# Patient Record
Sex: Male | Born: 1950 | Race: White | Hispanic: No | Marital: Married | Smoking: Former smoker
Health system: Southern US, Community
[De-identification: ages and names within clinical notes are randomized; demographics above are authoritative.]

## PROBLEM LIST (undated history)

## (undated) DIAGNOSIS — F32A Depression, unspecified: Secondary | ICD-10-CM

## (undated) DIAGNOSIS — T7840XA Allergy, unspecified, initial encounter: Secondary | ICD-10-CM

## (undated) DIAGNOSIS — M810 Age-related osteoporosis without current pathological fracture: Secondary | ICD-10-CM

## (undated) DIAGNOSIS — R011 Cardiac murmur, unspecified: Secondary | ICD-10-CM

## (undated) DIAGNOSIS — F419 Anxiety disorder, unspecified: Secondary | ICD-10-CM

## (undated) DIAGNOSIS — E785 Hyperlipidemia, unspecified: Secondary | ICD-10-CM

## (undated) DIAGNOSIS — C801 Malignant (primary) neoplasm, unspecified: Secondary | ICD-10-CM

## (undated) DIAGNOSIS — M1990 Unspecified osteoarthritis, unspecified site: Secondary | ICD-10-CM

## (undated) DIAGNOSIS — I10 Essential (primary) hypertension: Secondary | ICD-10-CM

## (undated) DIAGNOSIS — K219 Gastro-esophageal reflux disease without esophagitis: Secondary | ICD-10-CM

## (undated) HISTORY — DX: Allergy, unspecified, initial encounter: T78.40XA

## (undated) HISTORY — DX: Hyperlipidemia, unspecified: E78.5

## (undated) HISTORY — DX: Depression, unspecified: F32.A

## (undated) HISTORY — DX: Anxiety disorder, unspecified: F41.9

## (undated) HISTORY — DX: Malignant (primary) neoplasm, unspecified: C80.1

## (undated) HISTORY — DX: Cardiac murmur, unspecified: R01.1

## (undated) HISTORY — DX: Unspecified osteoarthritis, unspecified site: M19.90

## (undated) HISTORY — DX: Age-related osteoporosis without current pathological fracture: M81.0

---

## 2005-05-10 HISTORY — PX: THULIUM LASER TURP (TRANSURETHRAL RESECTION OF PROSTATE): SHX6744

## 2005-09-21 ENCOUNTER — Emergency Department (HOSPITAL_COMMUNITY): Admission: EM | Admit: 2005-09-21 | Discharge: 2005-09-22 | Payer: Self-pay | Admitting: Emergency Medicine

## 2005-09-22 ENCOUNTER — Emergency Department (HOSPITAL_COMMUNITY): Admission: EM | Admit: 2005-09-22 | Discharge: 2005-09-22 | Payer: Self-pay | Admitting: Emergency Medicine

## 2005-10-20 ENCOUNTER — Emergency Department (HOSPITAL_COMMUNITY): Admission: EM | Admit: 2005-10-20 | Discharge: 2005-10-21 | Payer: Self-pay | Admitting: Emergency Medicine

## 2005-10-24 ENCOUNTER — Emergency Department (HOSPITAL_COMMUNITY): Admission: EM | Admit: 2005-10-24 | Discharge: 2005-10-24 | Payer: Self-pay | Admitting: Emergency Medicine

## 2006-08-10 ENCOUNTER — Emergency Department (HOSPITAL_COMMUNITY): Admission: EM | Admit: 2006-08-10 | Discharge: 2006-08-10 | Payer: Self-pay | Admitting: Emergency Medicine

## 2006-08-11 ENCOUNTER — Ambulatory Visit (HOSPITAL_COMMUNITY): Admission: RE | Admit: 2006-08-11 | Discharge: 2006-08-11 | Payer: Self-pay | Admitting: Emergency Medicine

## 2008-05-10 HISTORY — PX: ROTATOR CUFF REPAIR: SHX139

## 2009-09-10 ENCOUNTER — Encounter: Admission: RE | Admit: 2009-09-10 | Discharge: 2009-09-10 | Payer: Self-pay | Admitting: Orthopedic Surgery

## 2010-05-31 ENCOUNTER — Encounter: Payer: Self-pay | Admitting: Orthopedic Surgery

## 2012-09-28 ENCOUNTER — Other Ambulatory Visit: Payer: Self-pay | Admitting: Family Medicine

## 2012-09-28 ENCOUNTER — Ambulatory Visit
Admission: RE | Admit: 2012-09-28 | Discharge: 2012-09-28 | Disposition: A | Discharge status: Home / Self Care | Payer: 59 | Source: Ambulatory Visit | Attending: Family Medicine | Admitting: Family Medicine

## 2012-09-28 DIAGNOSIS — R0789 Other chest pain: Secondary | ICD-10-CM

## 2012-10-25 LAB — HM COLONOSCOPY

## 2013-10-04 ENCOUNTER — Emergency Department (HOSPITAL_COMMUNITY)
Admission: EM | Admit: 2013-10-04 | Discharge: 2013-10-04 | Disposition: A | Discharge status: Home / Self Care | Payer: Managed Care, Other (non HMO) | Attending: Emergency Medicine | Admitting: Emergency Medicine

## 2013-10-04 ENCOUNTER — Encounter (HOSPITAL_COMMUNITY): Payer: Self-pay | Admitting: Emergency Medicine

## 2013-10-04 DIAGNOSIS — R110 Nausea: Secondary | ICD-10-CM | POA: Insufficient documentation

## 2013-10-04 DIAGNOSIS — K219 Gastro-esophageal reflux disease without esophagitis: Secondary | ICD-10-CM | POA: Insufficient documentation

## 2013-10-04 DIAGNOSIS — Z79899 Other long term (current) drug therapy: Secondary | ICD-10-CM | POA: Insufficient documentation

## 2013-10-04 DIAGNOSIS — R197 Diarrhea, unspecified: Secondary | ICD-10-CM

## 2013-10-04 DIAGNOSIS — I10 Essential (primary) hypertension: Secondary | ICD-10-CM | POA: Insufficient documentation

## 2013-10-04 DIAGNOSIS — R1084 Generalized abdominal pain: Secondary | ICD-10-CM | POA: Insufficient documentation

## 2013-10-04 DIAGNOSIS — E876 Hypokalemia: Secondary | ICD-10-CM | POA: Insufficient documentation

## 2013-10-04 HISTORY — DX: Essential (primary) hypertension: I10

## 2013-10-04 HISTORY — DX: Gastro-esophageal reflux disease without esophagitis: K21.9

## 2013-10-04 LAB — CBC WITH DIFFERENTIAL/PLATELET
BASOS ABS: 0 10*3/uL (ref 0.0–0.1)
BASOS PCT: 0 % (ref 0–1)
EOS ABS: 0 10*3/uL (ref 0.0–0.7)
Eosinophils Relative: 0 % (ref 0–5)
HCT: 40.2 % (ref 39.0–52.0)
Hemoglobin: 13.8 g/dL (ref 13.0–17.0)
LYMPHS PCT: 17 % (ref 12–46)
Lymphs Abs: 0.8 10*3/uL (ref 0.7–4.0)
MCH: 26.8 pg (ref 26.0–34.0)
MCHC: 34.3 g/dL (ref 30.0–36.0)
MCV: 78.2 fL (ref 78.0–100.0)
Monocytes Absolute: 1 10*3/uL (ref 0.1–1.0)
Monocytes Relative: 22 % — ABNORMAL HIGH (ref 3–12)
NEUTROS PCT: 61 % (ref 43–77)
Neutro Abs: 2.8 10*3/uL (ref 1.7–7.7)
PLATELETS: 200 10*3/uL (ref 150–400)
RBC: 5.14 MIL/uL (ref 4.22–5.81)
RDW: 14.3 % (ref 11.5–15.5)
WBC: 4.6 10*3/uL (ref 4.0–10.5)

## 2013-10-04 LAB — URINALYSIS, ROUTINE W REFLEX MICROSCOPIC
Bilirubin Urine: NEGATIVE
GLUCOSE, UA: NEGATIVE mg/dL
Hgb urine dipstick: NEGATIVE
KETONES UR: NEGATIVE mg/dL
Leukocytes, UA: NEGATIVE
Nitrite: NEGATIVE
PH: 7 (ref 5.0–8.0)
Protein, ur: NEGATIVE mg/dL
SPECIFIC GRAVITY, URINE: 1.005 (ref 1.005–1.030)
Urobilinogen, UA: 0.2 mg/dL (ref 0.0–1.0)

## 2013-10-04 LAB — COMPREHENSIVE METABOLIC PANEL
ALT: 24 U/L (ref 0–53)
AST: 30 U/L (ref 0–37)
Albumin: 3.3 g/dL — ABNORMAL LOW (ref 3.5–5.2)
Alkaline Phosphatase: 52 U/L (ref 39–117)
BILIRUBIN TOTAL: 0.3 mg/dL (ref 0.3–1.2)
BUN: 15 mg/dL (ref 6–23)
CALCIUM: 9.2 mg/dL (ref 8.4–10.5)
CO2: 26 meq/L (ref 19–32)
CREATININE: 0.89 mg/dL (ref 0.50–1.35)
Chloride: 92 mEq/L — ABNORMAL LOW (ref 96–112)
Glucose, Bld: 116 mg/dL — ABNORMAL HIGH (ref 70–99)
Potassium: 3.1 mEq/L — ABNORMAL LOW (ref 3.7–5.3)
SODIUM: 134 meq/L — AB (ref 137–147)
TOTAL PROTEIN: 7.3 g/dL (ref 6.0–8.3)

## 2013-10-04 LAB — LIPASE, BLOOD: Lipase: 57 U/L (ref 11–59)

## 2013-10-04 LAB — CLOSTRIDIUM DIFFICILE BY PCR: Toxigenic C. Difficile by PCR: NEGATIVE

## 2013-10-04 MED ORDER — DIPHENOXYLATE-ATROPINE 2.5-0.025 MG PO TABS: 2.0000 | ORAL_TABLET | Freq: Four times a day (QID) | ORAL | Status: DC | PRN

## 2013-10-04 MED ORDER — SODIUM CHLORIDE 0.9 % IV BOLUS (SEPSIS)
1000.0000 mL | INTRAVENOUS | Status: AC
  Administered 2013-10-04: 1000 mL via INTRAVENOUS

## 2013-10-04 MED ORDER — DIPHENOXYLATE-ATROPINE 2.5-0.025 MG PO TABS
2.0000 | ORAL_TABLET | ORAL | Status: AC
Start: 2013-10-04 — End: 2013-10-04
  Administered 2013-10-04: 2 via ORAL
  Filled 2013-10-04: qty 2

## 2013-10-04 MED ORDER — POTASSIUM CHLORIDE CRYS ER 20 MEQ PO TBCR
40.0000 meq | EXTENDED_RELEASE_TABLET | Freq: Once | ORAL | Status: AC
  Administered 2013-10-04: 40 meq via ORAL
  Filled 2013-10-04: qty 2

## 2013-10-04 NOTE — Discharge Instructions (Signed)
Take lomotil as needed for diarrhea. Refer to attached documents for more information. Return to the ED with worsening or concerning symptoms. Follow up with your doctor. You will be contacted with results of your stool culture.

## 2013-10-04 NOTE — ED Notes (Signed)
Bed: WA07 Expected date:  Expected time:  Means of arrival:  Comments: 

## 2013-10-04 NOTE — ED Provider Notes (Signed)
CSN: [id number]     Arrival date & time 10/04/13  4010 History   First MD Initiated Contact with Patient 10/04/13 1012     Chief Complaint  Patient presents with  . Diarrhea     (Consider location/radiation/quality/duration/timing/severity/associated sxs/prior Treatment) HPI Comments: Patient is a 63 year old male with a past medical history of hypertension and GERD who presents with a 5 days history of diarrhea. Patient reports 7-8 episodes of watery stool per day Symptoms started gradually and progressively worsened since the onset. Patient reports associated lower abdominal cramping and nausea. He saw his PCP who prescribed hyoscyamine and imodium for symptoms which provide some relief. Patient called his PCP this morning due to persistent symptoms and was instructed to come to the ED. Patient's PCP is concerned for viral gastroenteritis. Patient denies recent antibiotic use or foreign travel. He denies any other associated symptoms and recent foreign travel. No known sick contacts.    Past Medical History  Diagnosis Date  . Hypertension   . GERD (gastroesophageal reflux disease)    History reviewed. No pertinent past surgical history. History reviewed. No pertinent family history. History  Substance Use Topics  . Smoking status: Never Smoker   . Smokeless tobacco: Not on file  . Alcohol Use: Yes    Review of Systems  Constitutional: Negative for fever, chills and fatigue.  HENT: Negative for trouble swallowing.   Eyes: Negative for visual disturbance.  Respiratory: Negative for shortness of breath.   Cardiovascular: Negative for chest pain and palpitations.  Gastrointestinal: Positive for nausea, abdominal pain and diarrhea. Negative for vomiting and blood in stool.  Genitourinary: Negative for dysuria and difficulty urinating.  Musculoskeletal: Negative for arthralgias and neck pain.  Skin: Negative for color change.  Neurological: Negative for dizziness and weakness.   Psychiatric/Behavioral: Negative for dysphoric mood.      Allergies  No known allergies  Home Medications   Prior to Admission medications   Medication Sig Start Date End Date Taking? Authorizing Provider  hyoscyamine (LEVSIN SL) 0.125 MG SL tablet Place 0.125 mg under the tongue as needed.   Yes Historical Provider, MD  ibuprofen (ADVIL,MOTRIN) 200 MG tablet Take 400 mg by mouth every 6 (six) hours as needed for mild pain.   Yes Historical Provider, MD  loperamide (IMODIUM A-D) 2 MG tablet Take 2 mg by mouth 4 (four) times daily as needed for diarrhea or loose stools.   Yes Historical Provider, MD  naproxen sodium (ANAPROX) 550 MG tablet Take 550 mg by mouth 2 (two) times daily between meals as needed.   Yes Historical Provider, MD  pantoprazole (PROTONIX) 40 MG tablet Take 40 mg by mouth daily.   Yes Historical Provider, MD  pravastatin (PRAVACHOL) 40 MG tablet Take 40 mg by mouth daily.   Yes Historical Provider, MD  valsartan-hydrochlorothiazide (DIOVAN-HCT) 320-12.5 MG per tablet Take 1 tablet by mouth daily.   Yes Historical Provider, MD  venlafaxine (EFFEXOR) 37.5 MG tablet Take 37.5 mg by mouth 2 (two) times daily.   Yes Historical Provider, MD   BP 120/72  Pulse 100  Temp(Src) 98.7 F (37.1 C) (Oral)  Resp 18  SpO2 100% Physical Exam  Nursing note and vitals reviewed. Constitutional: He appears well-developed and well-nourished. No distress.  HENT:  Head: Normocephalic and atraumatic.  Eyes: Conjunctivae and EOM are normal.  Neck: Normal range of motion.  Cardiovascular: Normal rate and regular rhythm.  Exam reveals no gallop and no friction rub.   No murmur heard.  Pulmonary/Chest: Effort normal and breath sounds normal. He has no wheezes. He has no rales. He exhibits no tenderness.  Abdominal: Soft. He exhibits no distension. There is tenderness. There is no rebound and no guarding.  Mild generalized lower abdominal tenderness to palpation. No focal tenderness or  peritoneal signs.   Musculoskeletal: Normal range of motion.  Neurological: He is alert.  Speech is goal-oriented. Moves limbs without ataxia.   Skin: Skin is warm and dry.  Psychiatric: He has a normal mood and affect. His behavior is normal.    ED Course  Procedures (including critical care time) Labs Review Labs Reviewed  CBC WITH DIFFERENTIAL - Abnormal; Notable for the following:    Monocytes Relative 22 (*)    All other components within normal limits  COMPREHENSIVE METABOLIC PANEL - Abnormal; Notable for the following:    Sodium 134 (*)    Potassium 3.1 (*)    Chloride 92 (*)    Glucose, Bld 116 (*)    Albumin 3.3 (*)    All other components within normal limits  STOOL CULTURE  CLOSTRIDIUM DIFFICILE BY PCR  LIPASE, BLOOD  URINALYSIS, ROUTINE W REFLEX MICROSCOPIC    Imaging Review No results found.   EKG Interpretation None      MDM   Final diagnoses:  Diarrhea   10:31 AM Labs show hypokalemia. Patient will have fluids, PO potassium and lomotil for diarrhea. Patient mildly tachycardic with other vitals stable.   12:36 PM Patient provided a stool sample for culture. Patient will have another liter of fluids and be discharged with lomotil. Patient will follow up with PCP as needed. Patient instructed to return to the ED with worsening or concerning symptoms.    Alvina Chou, PA-C 10/04/13 1305

## 2013-10-04 NOTE — Progress Notes (Signed)
  CARE MANAGEMENT ED NOTE 10/04/2013  Patient:  Logan Wong, Logan Wong   Account Number:  [id number]  Date Initiated:  10/04/2013  Documentation initiated by:  Jackelyn Poling  Subjective/Objective Assessment:   63 yr old male [employer] managed [facility] pt without pcp listed c/o diarrhea since Sunday.  Dry heaves yesterday. Was sent in by PCP for possible viral gastroenteritis. States that he has been having a low abd cramping.  Was given some     Subjective/Objective Assessment Detail:   Was given something by his doctor for the cramping that he states has been helping a little bit.  Pt confirms pcp as Dr Maury Dus at [facility]     Action/Plan:   EPIC updated   Action/Plan Detail:   Anticipated DC Date:       Status Recommendation to Physician:   Result of Recommendation:    Other ED [place]  Other  PCP issues  Outpatient Services - Pt will follow up    Choice offered to / List presented to:            Status of service:  Completed, signed off  ED Comments:   ED Comments Detail:

## 2013-10-04 NOTE — ED Notes (Signed)
Pt c/o diarrhea since Sunday.  Dry heaves yesterday.  Was sent in by PCP for possible viral gastroenteritis.  States that he has been having a low abd cramping.  Was given something by his doctor for the cramping that he states has been helping a little bit.

## 2013-10-04 NOTE — ED Provider Notes (Signed)
Medical screening examination/treatment/procedure(s) were performed by non-physician practitioner and as supervising physician I was immediately available for consultation/collaboration.   EKG Interpretation None      Rolland Porter, MD, Abram Sander   Janice Norrie, MD 10/04/13 1309

## 2013-10-06 ENCOUNTER — Emergency Department (HOSPITAL_COMMUNITY)
Admission: EM | Admit: 2013-10-06 | Discharge: 2013-10-06 | Disposition: A | Discharge status: Home / Self Care | Payer: Managed Care, Other (non HMO) | Attending: Emergency Medicine | Admitting: Emergency Medicine

## 2013-10-06 ENCOUNTER — Encounter (HOSPITAL_COMMUNITY): Payer: Self-pay | Admitting: Emergency Medicine

## 2013-10-06 DIAGNOSIS — E876 Hypokalemia: Secondary | ICD-10-CM | POA: Insufficient documentation

## 2013-10-06 DIAGNOSIS — I10 Essential (primary) hypertension: Secondary | ICD-10-CM | POA: Insufficient documentation

## 2013-10-06 DIAGNOSIS — K219 Gastro-esophageal reflux disease without esophagitis: Secondary | ICD-10-CM | POA: Insufficient documentation

## 2013-10-06 DIAGNOSIS — R197 Diarrhea, unspecified: Secondary | ICD-10-CM

## 2013-10-06 DIAGNOSIS — Z79899 Other long term (current) drug therapy: Secondary | ICD-10-CM | POA: Insufficient documentation

## 2013-10-06 LAB — CBC
HEMATOCRIT: 37.1 % — AB (ref 39.0–52.0)
Hemoglobin: 12.8 g/dL — ABNORMAL LOW (ref 13.0–17.0)
MCH: 27.1 pg (ref 26.0–34.0)
MCHC: 34.5 g/dL (ref 30.0–36.0)
MCV: 78.4 fL (ref 78.0–100.0)
PLATELETS: 243 10*3/uL (ref 150–400)
RBC: 4.73 MIL/uL (ref 4.22–5.81)
RDW: 14.4 % (ref 11.5–15.5)
WBC: 8.3 10*3/uL (ref 4.0–10.5)

## 2013-10-06 LAB — BASIC METABOLIC PANEL
BUN: 10 mg/dL (ref 6–23)
CO2: 25 meq/L (ref 19–32)
Calcium: 8.9 mg/dL (ref 8.4–10.5)
Chloride: 95 mEq/L — ABNORMAL LOW (ref 96–112)
Creatinine, Ser: 0.79 mg/dL (ref 0.50–1.35)
Glucose, Bld: 98 mg/dL (ref 70–99)
POTASSIUM: 3 meq/L — AB (ref 3.7–5.3)
Sodium: 134 mEq/L — ABNORMAL LOW (ref 137–147)

## 2013-10-06 MED ORDER — SODIUM CHLORIDE 0.9 % IV BOLUS (SEPSIS)
1000.0000 mL | Freq: Once | INTRAVENOUS | Status: AC
  Administered 2013-10-06: 1000 mL via INTRAVENOUS

## 2013-10-06 MED ORDER — POTASSIUM CHLORIDE 20 MEQ/15ML (10%) PO LIQD: 40.0000 meq | Freq: Every day | ORAL | Status: DC

## 2013-10-06 MED ORDER — POTASSIUM CHLORIDE 20 MEQ/15ML (10%) PO LIQD
40.0000 meq | Freq: Once | ORAL | Status: AC
  Administered 2013-10-06: 40 meq via ORAL
  Filled 2013-10-06: qty 30

## 2013-10-06 NOTE — ED Provider Notes (Signed)
CSN: [id number]     Arrival date & time 10/06/13  0813 History   First MD Initiated Contact with Patient 10/06/13 [phone]     Chief Complaint  Patient presents with  . Diarrhea      HPI Patient reports ongoing diarrhea with past week.  He initially was having some myalgias thanks was feeling weak earlier in the week but now he states he feels much better.  He was seen in emergency department several days ago.  He states the only reason he returns to the ER today because of ongoing diarrhea.  He was tested for C. difficile colitis which was negative.  Stool cultures are pending.  No recent antibiotics.  No recent travel outside the [place].  He did visit a family member in the hospital setting a week ago.  The family member was not having diarrhea.  He has a history of hypertension and gastroesophageal reflux disease.  His medications have been stable.  He denies blood in the stool.  No fevers or chills.  Some abdominal cramping.  No current abdominal pain.   Past Medical History  Diagnosis Date  . Hypertension   . GERD (gastroesophageal reflux disease)    No past surgical history on file. No family history on file. History  Substance Use Topics  . Smoking status: Never Smoker   . Smokeless tobacco: Not on file  . Alcohol Use: Yes    Review of Systems  All other systems reviewed and are negative.     Allergies  No known allergies  Home Medications   Prior to Admission medications   Medication Sig Start Date End Date Taking? Authorizing Provider  Calcium-Vitamin D-Vitamin K (VIACTIV) [phone] MG-UNT-MCG CHEW Chew 1 tablet by mouth every morning.   Yes Historical Provider, MD  diphenoxylate-atropine (LOMOTIL) 2.5-0.025 MG per tablet Take 2 tablets by mouth 4 (four) times daily as needed for diarrhea or loose stools. 10/04/13  Yes Alvina Chou, PA-C  Multiple Vitamin (MULTIVITAMIN WITH MINERALS) TABS tablet Take 1 tablet by mouth every morning.   Yes Historical  Provider, MD  pantoprazole (PROTONIX) 40 MG tablet Take 40 mg by mouth every morning.    Yes Historical Provider, MD  pravastatin (PRAVACHOL) 40 MG tablet Take 40 mg by mouth every morning.    Yes Historical Provider, MD  testosterone (ANDROGEL) 50 MG/5GM (1%) GEL Place 5 g onto the skin every morning.   Yes Historical Provider, MD  valsartan-hydrochlorothiazide (DIOVAN-HCT) 320-12.5 MG per tablet Take 1 tablet by mouth every morning.    Yes Historical Provider, MD  venlafaxine (EFFEXOR) 37.5 MG tablet Take 37.5 mg by mouth 2 (two) times daily.   Yes Historical Provider, MD  potassium chloride 20 MEQ/15ML (10%) solution Take 30 mLs (40 mEq total) by mouth daily. 10/06/13   Hoy Morn, MD   BP 119/69  Pulse 110  Temp(Src) 98.4 F (36.9 C) (Oral)  Resp 18  SpO2 100% Physical Exam  Nursing note and vitals reviewed. Constitutional: He is oriented to person, place, and time. He appears well-developed and well-nourished.  HENT:  Head: Normocephalic and atraumatic.  Eyes: EOM are normal.  Neck: Normal range of motion.  Cardiovascular: Normal rate, regular rhythm, normal heart sounds and intact distal pulses.   Pulmonary/Chest: Effort normal and breath sounds normal. No respiratory distress.  Abdominal: Soft. He exhibits no distension. There is no tenderness.  Musculoskeletal: Normal range of motion.  Neurological: He is alert and oriented to person, place, and time.  Skin:  Skin is warm and dry.  Psychiatric: He has a normal mood and affect. Judgment normal.    ED Course  Procedures (including critical care time) Labs Review Labs Reviewed  CBC - Abnormal; Notable for the following:    Hemoglobin 12.8 (*)    HCT 37.1 (*)    All other components within normal limits  BASIC METABOLIC PANEL - Abnormal; Notable for the following:    Sodium 134 (*)    Potassium 3.0 (*)    Chloride 95 (*)    All other components within normal limits    Imaging Review No results found.   EKG  Interpretation None      MDM   Final diagnoses:  Diarrhea  Hypokalemia    Overall the patient is very well appearing.  He agrees he is feeling better.  He's just concerned about the ongoing diarrhea for 7 days.  He will followup with GI.  C. difficile was negative.  Potassium was replaced the emergency department and he will be sent home with a short course of potassium supplementation.  He understands the importance of GI followup.  If this continues he may require colonoscopy.  All questions answered.  Patient understands to return to the ER for new or worsening symptoms    Hoy Morn, MD 10/06/13 1027

## 2013-10-06 NOTE — ED Notes (Signed)
Pt reports less frequent diarrhea but still watery/brown diarrhea post eating. Pt denies n/v.

## 2013-10-06 NOTE — Discharge Instructions (Signed)
Diarrhea Diarrhea is frequent loose and watery bowel movements. It can cause you to feel weak and dehydrated. Dehydration can cause you to become tired and thirsty, have a dry mouth, and have decreased urination that often is dark yellow. Diarrhea is a sign of another problem, most often an infection that will not last long. In most cases, diarrhea typically lasts 2 3 days. However, it can last longer if it is a sign of something more serious. It is important to treat your diarrhea as directed by your caregive to lessen or prevent future episodes of diarrhea. CAUSES  Some common causes include:  Gastrointestinal infections caused by viruses, bacteria, or parasites.  Food poisoning or food allergies.  Certain medicines, such as antibiotics, chemotherapy, and laxatives.  Artificial sweeteners and fructose.  Digestive disorders. HOME CARE INSTRUCTIONS  Ensure adequate fluid intake (hydration): have 1 cup (8 oz) of fluid for each diarrhea episode. Avoid fluids that contain simple sugars or sports drinks, fruit juices, whole milk products, and sodas. Your urine should be clear or pale yellow if you are drinking enough fluids. Hydrate with an oral rehydration solution that you can purchase at pharmacies, retail stores, and online. You can prepare an oral rehydration solution at home by mixing the following ingredients together:    tsp table salt.   tsp baking soda.   tsp salt substitute containing potassium chloride.  1  tablespoons sugar.  1 L (34 oz) of water.  Certain foods and beverages may increase the speed at which food moves through the gastrointestinal (GI) tract. These foods and beverages should be avoided and include:  Caffeinated and alcoholic beverages.  High-fiber foods, such as raw fruits and vegetables, nuts, seeds, and whole grain breads and cereals.  Foods and beverages sweetened with sugar alcohols, such as xylitol, sorbitol, and mannitol.  Some foods may be well  tolerated and may help thicken stool including:  Starchy foods, such as rice, toast, pasta, low-sugar cereal, oatmeal, grits, baked potatoes, crackers, and bagels.  Bananas.  Applesauce.  Add probiotic-rich foods to help increase healthy bacteria in the GI tract, such as yogurt and fermented milk products.  Wash your hands well after each diarrhea episode.  Only take over-the-counter or prescription medicines as directed by your caregiver.  Take a warm bath to relieve any burning or pain from frequent diarrhea episodes. SEEK IMMEDIATE MEDICAL CARE IF:   You are unable to keep fluids down.  You have persistent vomiting.  You have blood in your stool, or your stools are black and tarry.  You do not urinate in 6 8 hours, or there is only a small amount of very dark urine.  You have abdominal pain that increases or localizes.  You have weakness, dizziness, confusion, or lightheadedness.  You have a severe headache.  Your diarrhea gets worse or does not get better.  You have a fever or persistent symptoms for more than 2 3 days.  You have a fever and your symptoms suddenly get worse. MAKE SURE YOU:   Understand these instructions.  Will watch your condition.  Will get help right away if you are not doing well or get worse. Document Released: 04/16/2002 Document Revised: 04/12/2012 Document Reviewed: 01/02/2012 ExitCare Patient Information 2014 [employer].  

## 2013-10-06 NOTE — ED Notes (Signed)
Pt was here 2 days ago for diarrhea.  Pt was given lamotil.  States that it helps make it less frequent but the consistency is the same.

## 2013-10-08 ENCOUNTER — Telehealth (HOSPITAL_BASED_OUTPATIENT_CLINIC_OR_DEPARTMENT_OTHER): Payer: Self-pay | Admitting: Emergency Medicine

## 2013-10-09 ENCOUNTER — Telehealth (HOSPITAL_BASED_OUTPATIENT_CLINIC_OR_DEPARTMENT_OTHER): Payer: Self-pay | Admitting: Emergency Medicine

## 2013-10-09 NOTE — Progress Notes (Signed)
ED Antimicrobial Stewardship Positive Culture Follow Up   Logan Wong is an 63 y.o. male who presented to [facility] on 10/06/2013 with a chief complaint of  Chief Complaint  Patient presents with  . Diarrhea    Recent Results (from the past 720 hour(s))  STOOL CULTURE     Status: None   Collection Time    10/04/13 11:23 AM      Result Value Ref Range Status   Specimen Description STOOL   Final   Special Requests Normal   Final   Culture     Final   Value: SALMONELLA SPECIES     Note: CRITICAL RESULT CALLED TO, READ BACK BY AND VERIFIED WITH: TRACI [email or web address]  ON [id number] BY [facility] REPORT FAXED BY REQUEST CRITICAL RESULT CALLED TO, READ BACK BY AND VERIFIED WITH: LYNN [email or web address]  ON [id number] BY [place] FAXED TO [facility] AT 4:30PM      ON 10/08/2013 BY MARCHE     Performed at [facility]   Report Status PENDING   Incomplete   Organism ID, Bacteria SALMONELLA SPECIES   Final  CLOSTRIDIUM DIFFICILE BY PCR     Status: None   Collection Time    10/04/13 12:39 PM      Result Value Ref Range Status   C difficile by pcr NEGATIVE  NEGATIVE Final   Comment: [facility]    [x]  Patient discharged originally without antimicrobial agent and treatment is now indicated.   Patient presented with chief complaint of persistent diarrhea. He tested negative for C.dif, no recent antibiotics, or travel outside of the [place] Stool culture came back positive for salmonella that is sensitive to ciprofloxacin and Bactrim.   New antibiotic prescription: Ciprofloxacin 500mg  PO BID x 7 days  ED Provider: Quincy Carnes, PA  [name] 10/09/2013, 9:33 AM Infectious Diseases Pharmacist Phone# [phone]

## 2013-10-09 NOTE — Telephone Encounter (Signed)
Post ED Visit - Positive Culture Follow-up: Successful Patient Follow-Up  Culture assessed and recommendations reviewed by: []  [place], Pharm.D., BCPS []  Heide Guile, Pharm.D., BCPS []  Alycia Rossetti, Pharm.D., BCPS [x]  [place], Pharm.D., BCPS, AAHIVP []  Legrand [place], Pharm.D., BCPS, AAHIVP  Positive stool culture  [x]  Patient discharged without antimicrobial prescription and treatment is now indicated []  Organism is resistant to prescribed ED discharge antimicrobial []  Patient with positive blood cultures  Changes discussed with ED provider: Quincy Carnes PA-C New antibiotic prescription: Cipro 500 mg BID x 7 days    [facility] 10/09/2013, 1:03 PM

## 2013-10-13 NOTE — Telephone Encounter (Addendum)
Unable to contact patient via phone. Sent letter. °

## 2013-11-09 LAB — STOOL CULTURE: Special Requests: NORMAL

## 2013-12-04 ENCOUNTER — Telehealth (HOSPITAL_BASED_OUTPATIENT_CLINIC_OR_DEPARTMENT_OTHER): Payer: Self-pay | Admitting: Emergency Medicine

## 2013-12-04 NOTE — Telephone Encounter (Signed)
No response to letter sent after 30 days. Chart sent to Medical Records. °

## 2015-01-10 ENCOUNTER — Ambulatory Visit
Admission: RE | Admit: 2015-01-10 | Discharge: 2015-01-10 | Disposition: A | Discharge status: Home / Self Care | Payer: Managed Care, Other (non HMO) | Source: Ambulatory Visit | Attending: Family Medicine | Admitting: Family Medicine

## 2015-01-10 ENCOUNTER — Other Ambulatory Visit: Payer: Self-pay | Admitting: Family Medicine

## 2015-01-10 DIAGNOSIS — M19042 Primary osteoarthritis, left hand: Secondary | ICD-10-CM

## 2015-01-10 DIAGNOSIS — M19041 Primary osteoarthritis, right hand: Secondary | ICD-10-CM

## 2015-09-09 DIAGNOSIS — K219 Gastro-esophageal reflux disease without esophagitis: Secondary | ICD-10-CM | POA: Diagnosis not present

## 2015-09-09 DIAGNOSIS — E782 Mixed hyperlipidemia: Secondary | ICD-10-CM | POA: Diagnosis not present

## 2015-09-09 DIAGNOSIS — I10 Essential (primary) hypertension: Secondary | ICD-10-CM | POA: Diagnosis not present

## 2015-09-09 DIAGNOSIS — E291 Testicular hypofunction: Secondary | ICD-10-CM | POA: Diagnosis not present

## 2015-09-09 DIAGNOSIS — Z0000 Encounter for general adult medical examination without abnormal findings: Secondary | ICD-10-CM | POA: Diagnosis not present

## 2015-10-21 DIAGNOSIS — E782 Mixed hyperlipidemia: Secondary | ICD-10-CM | POA: Diagnosis not present

## 2015-10-21 DIAGNOSIS — Z125 Encounter for screening for malignant neoplasm of prostate: Secondary | ICD-10-CM | POA: Diagnosis not present

## 2015-10-21 DIAGNOSIS — E559 Vitamin D deficiency, unspecified: Secondary | ICD-10-CM | POA: Diagnosis not present

## 2015-10-21 DIAGNOSIS — I10 Essential (primary) hypertension: Secondary | ICD-10-CM | POA: Diagnosis not present

## 2015-10-27 DIAGNOSIS — B078 Other viral warts: Secondary | ICD-10-CM | POA: Diagnosis not present

## 2015-12-15 DIAGNOSIS — M19041 Primary osteoarthritis, right hand: Secondary | ICD-10-CM | POA: Diagnosis not present

## 2015-12-15 DIAGNOSIS — M67442 Ganglion, left hand: Secondary | ICD-10-CM | POA: Diagnosis not present

## 2015-12-15 DIAGNOSIS — M19042 Primary osteoarthritis, left hand: Secondary | ICD-10-CM | POA: Diagnosis not present

## 2016-01-01 DIAGNOSIS — M67442 Ganglion, left hand: Secondary | ICD-10-CM | POA: Diagnosis not present

## 2016-03-16 DIAGNOSIS — E782 Mixed hyperlipidemia: Secondary | ICD-10-CM | POA: Diagnosis not present

## 2016-03-16 DIAGNOSIS — I10 Essential (primary) hypertension: Secondary | ICD-10-CM | POA: Diagnosis not present

## 2016-03-16 DIAGNOSIS — E291 Testicular hypofunction: Secondary | ICD-10-CM | POA: Diagnosis not present

## 2016-03-16 DIAGNOSIS — K219 Gastro-esophageal reflux disease without esophagitis: Secondary | ICD-10-CM | POA: Diagnosis not present

## 2016-06-02 DIAGNOSIS — E291 Testicular hypofunction: Secondary | ICD-10-CM | POA: Diagnosis not present

## 2016-11-08 DIAGNOSIS — K219 Gastro-esophageal reflux disease without esophagitis: Secondary | ICD-10-CM | POA: Diagnosis not present

## 2016-11-08 DIAGNOSIS — E782 Mixed hyperlipidemia: Secondary | ICD-10-CM | POA: Diagnosis not present

## 2016-11-08 DIAGNOSIS — I10 Essential (primary) hypertension: Secondary | ICD-10-CM | POA: Diagnosis not present

## 2016-11-08 DIAGNOSIS — E291 Testicular hypofunction: Secondary | ICD-10-CM | POA: Diagnosis not present

## 2016-11-08 DIAGNOSIS — Z6824 Body mass index (BMI) 24.0-24.9, adult: Secondary | ICD-10-CM | POA: Diagnosis not present

## 2016-11-08 DIAGNOSIS — Z0000 Encounter for general adult medical examination without abnormal findings: Secondary | ICD-10-CM | POA: Diagnosis not present

## 2016-11-08 DIAGNOSIS — Z125 Encounter for screening for malignant neoplasm of prostate: Secondary | ICD-10-CM | POA: Diagnosis not present

## 2017-04-14 DIAGNOSIS — J069 Acute upper respiratory infection, unspecified: Secondary | ICD-10-CM | POA: Diagnosis not present

## 2017-04-20 DIAGNOSIS — J209 Acute bronchitis, unspecified: Secondary | ICD-10-CM | POA: Diagnosis not present

## 2017-04-25 ENCOUNTER — Ambulatory Visit
Admission: RE | Admit: 2017-04-25 | Discharge: 2017-04-25 | Disposition: A | Discharge status: Home / Self Care | Payer: BLUE CROSS/BLUE SHIELD | Source: Ambulatory Visit | Attending: Family Medicine | Admitting: Family Medicine

## 2017-04-25 ENCOUNTER — Other Ambulatory Visit: Payer: Self-pay | Admitting: Family Medicine

## 2017-04-25 DIAGNOSIS — R05 Cough: Secondary | ICD-10-CM | POA: Diagnosis not present

## 2017-04-25 DIAGNOSIS — B9789 Other viral agents as the cause of diseases classified elsewhere: Secondary | ICD-10-CM | POA: Diagnosis not present

## 2017-04-25 DIAGNOSIS — R053 Chronic cough: Secondary | ICD-10-CM

## 2017-04-25 DIAGNOSIS — J069 Acute upper respiratory infection, unspecified: Secondary | ICD-10-CM | POA: Diagnosis not present

## 2017-05-10 HISTORY — PX: PROSTATECTOMY: SHX69

## 2017-08-18 DIAGNOSIS — E782 Mixed hyperlipidemia: Secondary | ICD-10-CM | POA: Diagnosis not present

## 2017-08-18 DIAGNOSIS — Z1389 Encounter for screening for other disorder: Secondary | ICD-10-CM | POA: Diagnosis not present

## 2017-08-18 DIAGNOSIS — I10 Essential (primary) hypertension: Secondary | ICD-10-CM | POA: Diagnosis not present

## 2017-08-18 DIAGNOSIS — E291 Testicular hypofunction: Secondary | ICD-10-CM | POA: Diagnosis not present

## 2017-08-18 DIAGNOSIS — K219 Gastro-esophageal reflux disease without esophagitis: Secondary | ICD-10-CM | POA: Diagnosis not present

## 2017-11-14 DIAGNOSIS — I10 Essential (primary) hypertension: Secondary | ICD-10-CM | POA: Diagnosis not present

## 2017-11-14 DIAGNOSIS — Z23 Encounter for immunization: Secondary | ICD-10-CM | POA: Diagnosis not present

## 2017-11-14 DIAGNOSIS — Z0000 Encounter for general adult medical examination without abnormal findings: Secondary | ICD-10-CM | POA: Diagnosis not present

## 2017-11-14 DIAGNOSIS — E782 Mixed hyperlipidemia: Secondary | ICD-10-CM | POA: Diagnosis not present

## 2017-11-14 DIAGNOSIS — K219 Gastro-esophageal reflux disease without esophagitis: Secondary | ICD-10-CM | POA: Diagnosis not present

## 2017-11-14 DIAGNOSIS — E291 Testicular hypofunction: Secondary | ICD-10-CM | POA: Diagnosis not present

## 2017-11-17 DIAGNOSIS — E291 Testicular hypofunction: Secondary | ICD-10-CM | POA: Diagnosis not present

## 2017-11-17 DIAGNOSIS — N529 Male erectile dysfunction, unspecified: Secondary | ICD-10-CM | POA: Diagnosis not present

## 2017-12-13 DIAGNOSIS — D509 Iron deficiency anemia, unspecified: Secondary | ICD-10-CM | POA: Diagnosis not present

## 2017-12-13 DIAGNOSIS — E782 Mixed hyperlipidemia: Secondary | ICD-10-CM | POA: Diagnosis not present

## 2017-12-13 DIAGNOSIS — I10 Essential (primary) hypertension: Secondary | ICD-10-CM | POA: Diagnosis not present

## 2017-12-13 DIAGNOSIS — E291 Testicular hypofunction: Secondary | ICD-10-CM | POA: Diagnosis not present

## 2017-12-13 DIAGNOSIS — Z0000 Encounter for general adult medical examination without abnormal findings: Secondary | ICD-10-CM | POA: Diagnosis not present

## 2018-01-10 DIAGNOSIS — R972 Elevated prostate specific antigen [PSA]: Secondary | ICD-10-CM | POA: Diagnosis not present

## 2018-02-12 DIAGNOSIS — M62838 Other muscle spasm: Secondary | ICD-10-CM | POA: Diagnosis not present

## 2018-02-13 DIAGNOSIS — M810 Age-related osteoporosis without current pathological fracture: Secondary | ICD-10-CM | POA: Diagnosis not present

## 2018-02-20 DIAGNOSIS — R972 Elevated prostate specific antigen [PSA]: Secondary | ICD-10-CM | POA: Diagnosis not present

## 2018-03-01 DIAGNOSIS — R972 Elevated prostate specific antigen [PSA]: Secondary | ICD-10-CM | POA: Diagnosis not present

## 2018-03-09 DIAGNOSIS — C61 Malignant neoplasm of prostate: Secondary | ICD-10-CM | POA: Diagnosis not present

## 2018-03-24 DIAGNOSIS — C61 Malignant neoplasm of prostate: Secondary | ICD-10-CM | POA: Diagnosis not present

## 2018-03-24 DIAGNOSIS — K7689 Other specified diseases of liver: Secondary | ICD-10-CM | POA: Diagnosis not present

## 2018-03-31 DIAGNOSIS — C61 Malignant neoplasm of prostate: Secondary | ICD-10-CM | POA: Diagnosis not present

## 2018-04-07 DIAGNOSIS — C61 Malignant neoplasm of prostate: Secondary | ICD-10-CM | POA: Diagnosis not present

## 2018-04-11 DIAGNOSIS — C61 Malignant neoplasm of prostate: Secondary | ICD-10-CM | POA: Diagnosis not present

## 2018-04-13 DIAGNOSIS — C61 Malignant neoplasm of prostate: Secondary | ICD-10-CM | POA: Diagnosis not present

## 2018-04-17 DIAGNOSIS — C61 Malignant neoplasm of prostate: Secondary | ICD-10-CM | POA: Diagnosis not present

## 2018-04-17 DIAGNOSIS — Z01810 Encounter for preprocedural cardiovascular examination: Secondary | ICD-10-CM | POA: Diagnosis not present

## 2018-04-17 DIAGNOSIS — Z01812 Encounter for preprocedural laboratory examination: Secondary | ICD-10-CM | POA: Diagnosis not present

## 2018-04-19 DIAGNOSIS — Z79899 Other long term (current) drug therapy: Secondary | ICD-10-CM | POA: Diagnosis not present

## 2018-04-19 DIAGNOSIS — I10 Essential (primary) hypertension: Secondary | ICD-10-CM | POA: Diagnosis not present

## 2018-04-19 DIAGNOSIS — K219 Gastro-esophageal reflux disease without esophagitis: Secondary | ICD-10-CM | POA: Diagnosis not present

## 2018-04-19 DIAGNOSIS — J449 Chronic obstructive pulmonary disease, unspecified: Secondary | ICD-10-CM | POA: Diagnosis not present

## 2018-04-19 DIAGNOSIS — F329 Major depressive disorder, single episode, unspecified: Secondary | ICD-10-CM | POA: Diagnosis not present

## 2018-04-19 DIAGNOSIS — C61 Malignant neoplasm of prostate: Secondary | ICD-10-CM | POA: Diagnosis not present

## 2018-04-19 DIAGNOSIS — F419 Anxiety disorder, unspecified: Secondary | ICD-10-CM | POA: Diagnosis not present

## 2018-04-19 DIAGNOSIS — F17210 Nicotine dependence, cigarettes, uncomplicated: Secondary | ICD-10-CM | POA: Diagnosis not present

## 2018-04-19 DIAGNOSIS — E785 Hyperlipidemia, unspecified: Secondary | ICD-10-CM | POA: Diagnosis not present

## 2018-04-20 DIAGNOSIS — C61 Malignant neoplasm of prostate: Secondary | ICD-10-CM | POA: Insufficient documentation

## 2018-04-27 DIAGNOSIS — C61 Malignant neoplasm of prostate: Secondary | ICD-10-CM | POA: Diagnosis not present

## 2018-05-09 DIAGNOSIS — C61 Malignant neoplasm of prostate: Secondary | ICD-10-CM | POA: Diagnosis not present

## 2018-05-23 DIAGNOSIS — C61 Malignant neoplasm of prostate: Secondary | ICD-10-CM | POA: Diagnosis not present

## 2018-05-31 DIAGNOSIS — E782 Mixed hyperlipidemia: Secondary | ICD-10-CM | POA: Diagnosis not present

## 2018-05-31 DIAGNOSIS — K219 Gastro-esophageal reflux disease without esophagitis: Secondary | ICD-10-CM | POA: Diagnosis not present

## 2018-05-31 DIAGNOSIS — E291 Testicular hypofunction: Secondary | ICD-10-CM | POA: Diagnosis not present

## 2018-05-31 DIAGNOSIS — I10 Essential (primary) hypertension: Secondary | ICD-10-CM | POA: Diagnosis not present

## 2018-07-04 IMAGING — CR DG CHEST 2V
2 series · 2 of 2 positions shown · non-contrast
Comparison: 09/28/2012 .

CLINICAL DATA: Dry cough.  No fever.

EXAM:
CHEST  2 VIEW

[w chest pa]
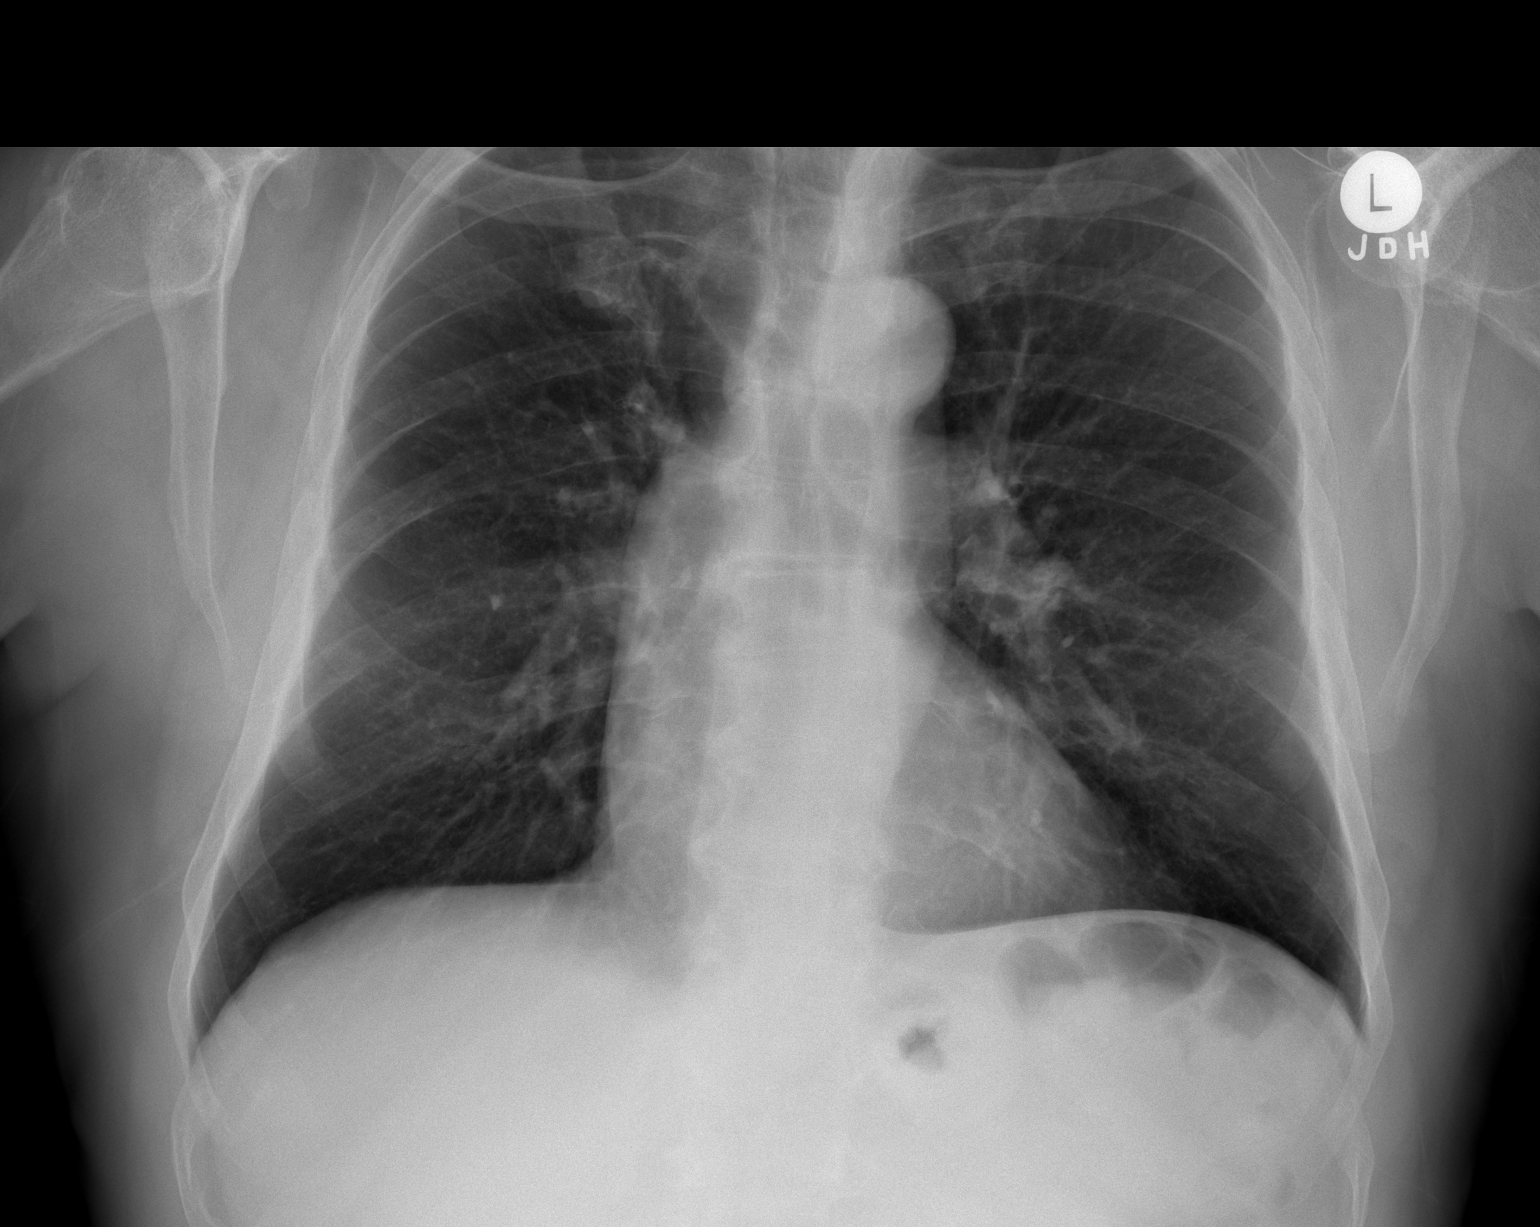

[w chest lat]
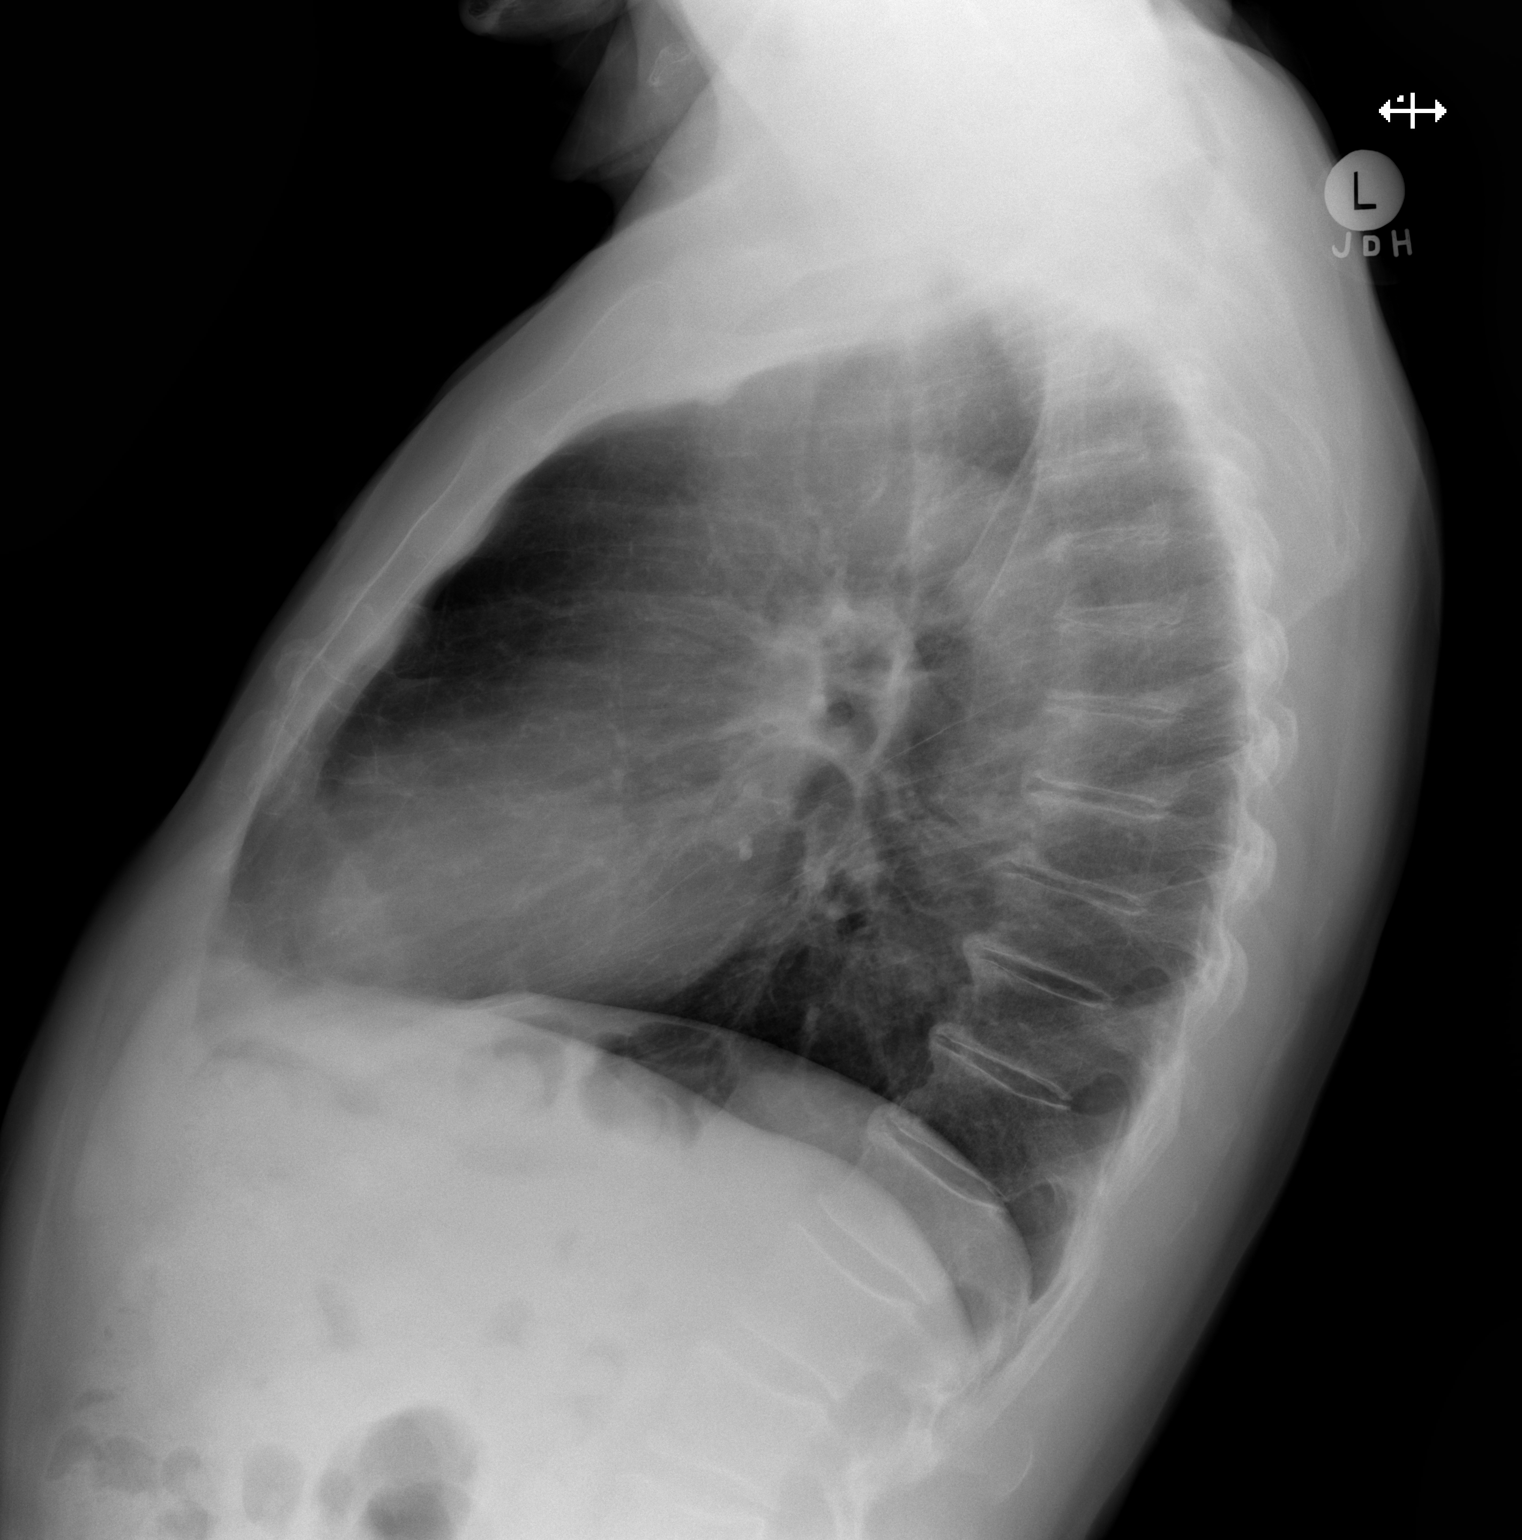

[2 of 2 positions shown; findings below may reference images not displayed]

FINDINGS: Mediastinum and hilar structures normal. Heart size normal. No focal
infiltrate. No pleural effusion or pneumothorax. No acute bony
abnormality. Degenerative changes thoracic spine .
IMPRESSION: No acute abnormality .

## 2018-09-13 DIAGNOSIS — M25461 Effusion, right knee: Secondary | ICD-10-CM | POA: Diagnosis not present

## 2018-09-21 DIAGNOSIS — C61 Malignant neoplasm of prostate: Secondary | ICD-10-CM | POA: Diagnosis not present

## 2018-10-16 DIAGNOSIS — M25561 Pain in right knee: Secondary | ICD-10-CM | POA: Diagnosis not present

## 2018-11-29 DIAGNOSIS — M25561 Pain in right knee: Secondary | ICD-10-CM | POA: Diagnosis not present

## 2018-12-21 DIAGNOSIS — C61 Malignant neoplasm of prostate: Secondary | ICD-10-CM | POA: Diagnosis not present

## 2019-01-31 DIAGNOSIS — M25561 Pain in right knee: Secondary | ICD-10-CM | POA: Diagnosis not present

## 2019-03-23 DIAGNOSIS — C61 Malignant neoplasm of prostate: Secondary | ICD-10-CM | POA: Diagnosis not present

## 2019-06-01 ENCOUNTER — Ambulatory Visit: Payer: Self-pay | Attending: Internal Medicine

## 2019-06-01 DIAGNOSIS — Z23 Encounter for immunization: Secondary | ICD-10-CM | POA: Insufficient documentation

## 2019-06-01 NOTE — Progress Notes (Signed)
   Covid-19 Vaccination Clinic  Name:  Logan Wong    MRN: [id number] DOB: 1950-08-26  06/01/2019  Mr. Logan Wong was observed post Covid-19 immunization for 15 minutes without incidence. He was provided with Vaccine Information Sheet and instruction to access the V-Safe system.   Mr. Logan Wong was instructed to call 911 with any severe reactions post vaccine: Marland Kitchen Difficulty breathing  . Swelling of your face and throat  . A fast heartbeat  . A bad rash all over your body  . Dizziness and weakness    Immunizations Administered    Name Date Dose VIS Date Route   Pfizer COVID-19 Vaccine 06/01/2019  6:12 PM 0.3 mL 04/20/2019 Intramuscular   Manufacturer: [place]   Lot: [id number]   [place]: [id number]

## 2019-06-22 ENCOUNTER — Ambulatory Visit: Payer: Self-pay | Attending: Internal Medicine

## 2019-06-22 DIAGNOSIS — C61 Malignant neoplasm of prostate: Secondary | ICD-10-CM | POA: Diagnosis not present

## 2019-06-22 DIAGNOSIS — Z23 Encounter for immunization: Secondary | ICD-10-CM | POA: Insufficient documentation

## 2019-06-22 NOTE — Progress Notes (Signed)
   Covid-19 Vaccination Clinic  Name:  ADEEB LEGGE    MRN: [id number] DOB: 1950-06-12  06/22/2019  Mr. Loveday was observed post Covid-19 immunization for 15 minutes without incidence. He was provided with Vaccine Information Sheet and instruction to access the V-Safe system.   Mr. Moody was instructed to call 911 with any severe reactions post vaccine: Marland Kitchen Difficulty breathing  . Swelling of your face and throat  . A fast heartbeat  . A bad rash all over your body  . Dizziness and weakness    Immunizations Administered    Name Date Dose VIS Date Route   Pfizer COVID-19 Vaccine 06/22/2019  5:01 PM 0.3 mL 04/20/2019 Intramuscular   Manufacturer: [place]   Lot: [id number]   [place]: [id number]

## 2019-07-26 DIAGNOSIS — F419 Anxiety disorder, unspecified: Secondary | ICD-10-CM | POA: Diagnosis not present

## 2019-07-26 DIAGNOSIS — E782 Mixed hyperlipidemia: Secondary | ICD-10-CM | POA: Diagnosis not present

## 2019-07-26 DIAGNOSIS — E291 Testicular hypofunction: Secondary | ICD-10-CM | POA: Diagnosis not present

## 2019-07-26 DIAGNOSIS — M810 Age-related osteoporosis without current pathological fracture: Secondary | ICD-10-CM | POA: Diagnosis not present

## 2019-07-26 DIAGNOSIS — M2550 Pain in unspecified joint: Secondary | ICD-10-CM | POA: Diagnosis not present

## 2019-07-26 DIAGNOSIS — D509 Iron deficiency anemia, unspecified: Secondary | ICD-10-CM | POA: Diagnosis not present

## 2019-07-26 DIAGNOSIS — I10 Essential (primary) hypertension: Secondary | ICD-10-CM | POA: Diagnosis not present

## 2019-07-26 DIAGNOSIS — N529 Male erectile dysfunction, unspecified: Secondary | ICD-10-CM | POA: Diagnosis not present

## 2019-07-26 DIAGNOSIS — Z0000 Encounter for general adult medical examination without abnormal findings: Secondary | ICD-10-CM | POA: Diagnosis not present

## 2019-07-26 DIAGNOSIS — K219 Gastro-esophageal reflux disease without esophagitis: Secondary | ICD-10-CM | POA: Diagnosis not present

## 2019-07-26 DIAGNOSIS — G2581 Restless legs syndrome: Secondary | ICD-10-CM | POA: Diagnosis not present

## 2019-07-26 DIAGNOSIS — Z8546 Personal history of malignant neoplasm of prostate: Secondary | ICD-10-CM | POA: Diagnosis not present

## 2019-08-08 DIAGNOSIS — E782 Mixed hyperlipidemia: Secondary | ICD-10-CM | POA: Diagnosis not present

## 2019-08-08 DIAGNOSIS — I10 Essential (primary) hypertension: Secondary | ICD-10-CM | POA: Diagnosis not present

## 2019-08-17 DIAGNOSIS — Z87891 Personal history of nicotine dependence: Secondary | ICD-10-CM | POA: Diagnosis not present

## 2019-08-17 DIAGNOSIS — Z136 Encounter for screening for cardiovascular disorders: Secondary | ICD-10-CM | POA: Diagnosis not present

## 2019-08-31 DIAGNOSIS — Z87891 Personal history of nicotine dependence: Secondary | ICD-10-CM | POA: Diagnosis not present

## 2019-08-31 DIAGNOSIS — Z136 Encounter for screening for cardiovascular disorders: Secondary | ICD-10-CM | POA: Diagnosis not present

## 2019-09-21 DIAGNOSIS — C61 Malignant neoplasm of prostate: Secondary | ICD-10-CM | POA: Diagnosis not present

## 2019-10-04 DIAGNOSIS — E782 Mixed hyperlipidemia: Secondary | ICD-10-CM | POA: Diagnosis not present

## 2019-10-04 DIAGNOSIS — M810 Age-related osteoporosis without current pathological fracture: Secondary | ICD-10-CM | POA: Diagnosis not present

## 2019-10-04 DIAGNOSIS — I10 Essential (primary) hypertension: Secondary | ICD-10-CM | POA: Diagnosis not present

## 2019-10-04 DIAGNOSIS — D509 Iron deficiency anemia, unspecified: Secondary | ICD-10-CM | POA: Diagnosis not present

## 2019-10-04 DIAGNOSIS — Z8546 Personal history of malignant neoplasm of prostate: Secondary | ICD-10-CM | POA: Diagnosis not present

## 2019-10-04 DIAGNOSIS — M19041 Primary osteoarthritis, right hand: Secondary | ICD-10-CM | POA: Diagnosis not present

## 2019-11-06 DIAGNOSIS — M67442 Ganglion, left hand: Secondary | ICD-10-CM | POA: Diagnosis not present

## 2019-11-21 DIAGNOSIS — M67442 Ganglion, left hand: Secondary | ICD-10-CM | POA: Diagnosis not present

## 2020-01-24 DIAGNOSIS — K219 Gastro-esophageal reflux disease without esophagitis: Secondary | ICD-10-CM | POA: Diagnosis not present

## 2020-01-24 DIAGNOSIS — D509 Iron deficiency anemia, unspecified: Secondary | ICD-10-CM | POA: Diagnosis not present

## 2020-01-24 DIAGNOSIS — M67449 Ganglion, unspecified hand: Secondary | ICD-10-CM | POA: Diagnosis not present

## 2020-01-24 DIAGNOSIS — M720 Palmar fascial fibromatosis [Dupuytren]: Secondary | ICD-10-CM | POA: Diagnosis not present

## 2020-01-24 DIAGNOSIS — F419 Anxiety disorder, unspecified: Secondary | ICD-10-CM | POA: Diagnosis not present

## 2020-01-24 DIAGNOSIS — E782 Mixed hyperlipidemia: Secondary | ICD-10-CM | POA: Diagnosis not present

## 2020-01-24 DIAGNOSIS — I10 Essential (primary) hypertension: Secondary | ICD-10-CM | POA: Diagnosis not present

## 2020-01-24 DIAGNOSIS — M810 Age-related osteoporosis without current pathological fracture: Secondary | ICD-10-CM | POA: Diagnosis not present

## 2020-01-24 DIAGNOSIS — M2550 Pain in unspecified joint: Secondary | ICD-10-CM | POA: Diagnosis not present

## 2020-01-24 DIAGNOSIS — M6530 Trigger finger, unspecified finger: Secondary | ICD-10-CM | POA: Diagnosis not present

## 2020-01-24 DIAGNOSIS — E291 Testicular hypofunction: Secondary | ICD-10-CM | POA: Diagnosis not present

## 2020-01-24 DIAGNOSIS — G2581 Restless legs syndrome: Secondary | ICD-10-CM | POA: Diagnosis not present

## 2020-01-25 DIAGNOSIS — C61 Malignant neoplasm of prostate: Secondary | ICD-10-CM | POA: Diagnosis not present

## 2020-02-22 DIAGNOSIS — Z20822 Contact with and (suspected) exposure to covid-19: Secondary | ICD-10-CM | POA: Diagnosis not present

## 2020-02-22 DIAGNOSIS — J069 Acute upper respiratory infection, unspecified: Secondary | ICD-10-CM | POA: Diagnosis not present

## 2020-02-28 DIAGNOSIS — J069 Acute upper respiratory infection, unspecified: Secondary | ICD-10-CM | POA: Diagnosis not present

## 2020-03-25 DIAGNOSIS — E782 Mixed hyperlipidemia: Secondary | ICD-10-CM | POA: Diagnosis not present

## 2020-03-25 DIAGNOSIS — K219 Gastro-esophageal reflux disease without esophagitis: Secondary | ICD-10-CM | POA: Diagnosis not present

## 2020-03-25 DIAGNOSIS — M19041 Primary osteoarthritis, right hand: Secondary | ICD-10-CM | POA: Diagnosis not present

## 2020-03-25 DIAGNOSIS — I10 Essential (primary) hypertension: Secondary | ICD-10-CM | POA: Diagnosis not present

## 2020-03-25 DIAGNOSIS — D509 Iron deficiency anemia, unspecified: Secondary | ICD-10-CM | POA: Diagnosis not present

## 2020-03-25 DIAGNOSIS — M810 Age-related osteoporosis without current pathological fracture: Secondary | ICD-10-CM | POA: Diagnosis not present

## 2020-03-25 DIAGNOSIS — Z8546 Personal history of malignant neoplasm of prostate: Secondary | ICD-10-CM | POA: Diagnosis not present

## 2020-05-08 DIAGNOSIS — I10 Essential (primary) hypertension: Secondary | ICD-10-CM | POA: Diagnosis not present

## 2020-05-08 DIAGNOSIS — E782 Mixed hyperlipidemia: Secondary | ICD-10-CM | POA: Diagnosis not present

## 2020-05-08 DIAGNOSIS — Z8546 Personal history of malignant neoplasm of prostate: Secondary | ICD-10-CM | POA: Diagnosis not present

## 2020-05-08 DIAGNOSIS — M19041 Primary osteoarthritis, right hand: Secondary | ICD-10-CM | POA: Diagnosis not present

## 2020-05-08 DIAGNOSIS — K219 Gastro-esophageal reflux disease without esophagitis: Secondary | ICD-10-CM | POA: Diagnosis not present

## 2020-05-08 DIAGNOSIS — D509 Iron deficiency anemia, unspecified: Secondary | ICD-10-CM | POA: Diagnosis not present

## 2020-05-08 DIAGNOSIS — M810 Age-related osteoporosis without current pathological fracture: Secondary | ICD-10-CM | POA: Diagnosis not present

## 2020-05-13 DIAGNOSIS — C61 Malignant neoplasm of prostate: Secondary | ICD-10-CM | POA: Diagnosis not present

## 2020-06-30 DIAGNOSIS — M19041 Primary osteoarthritis, right hand: Secondary | ICD-10-CM | POA: Diagnosis not present

## 2020-06-30 DIAGNOSIS — E782 Mixed hyperlipidemia: Secondary | ICD-10-CM | POA: Diagnosis not present

## 2020-06-30 DIAGNOSIS — I10 Essential (primary) hypertension: Secondary | ICD-10-CM | POA: Diagnosis not present

## 2020-06-30 DIAGNOSIS — D509 Iron deficiency anemia, unspecified: Secondary | ICD-10-CM | POA: Diagnosis not present

## 2020-06-30 DIAGNOSIS — M810 Age-related osteoporosis without current pathological fracture: Secondary | ICD-10-CM | POA: Diagnosis not present

## 2020-06-30 DIAGNOSIS — K219 Gastro-esophageal reflux disease without esophagitis: Secondary | ICD-10-CM | POA: Diagnosis not present

## 2020-06-30 DIAGNOSIS — Z8546 Personal history of malignant neoplasm of prostate: Secondary | ICD-10-CM | POA: Diagnosis not present

## 2020-07-31 DIAGNOSIS — M810 Age-related osteoporosis without current pathological fracture: Secondary | ICD-10-CM | POA: Diagnosis not present

## 2020-07-31 DIAGNOSIS — E291 Testicular hypofunction: Secondary | ICD-10-CM | POA: Diagnosis not present

## 2020-07-31 DIAGNOSIS — F419 Anxiety disorder, unspecified: Secondary | ICD-10-CM | POA: Diagnosis not present

## 2020-07-31 DIAGNOSIS — E782 Mixed hyperlipidemia: Secondary | ICD-10-CM | POA: Diagnosis not present

## 2020-07-31 DIAGNOSIS — I10 Essential (primary) hypertension: Secondary | ICD-10-CM | POA: Diagnosis not present

## 2020-07-31 DIAGNOSIS — K219 Gastro-esophageal reflux disease without esophagitis: Secondary | ICD-10-CM | POA: Diagnosis not present

## 2020-07-31 DIAGNOSIS — M720 Palmar fascial fibromatosis [Dupuytren]: Secondary | ICD-10-CM | POA: Diagnosis not present

## 2020-07-31 DIAGNOSIS — Z0000 Encounter for general adult medical examination without abnormal findings: Secondary | ICD-10-CM | POA: Diagnosis not present

## 2020-07-31 DIAGNOSIS — M2550 Pain in unspecified joint: Secondary | ICD-10-CM | POA: Diagnosis not present

## 2020-07-31 DIAGNOSIS — M67449 Ganglion, unspecified hand: Secondary | ICD-10-CM | POA: Diagnosis not present

## 2020-07-31 DIAGNOSIS — D509 Iron deficiency anemia, unspecified: Secondary | ICD-10-CM | POA: Diagnosis not present

## 2020-08-09 DIAGNOSIS — H524 Presbyopia: Secondary | ICD-10-CM | POA: Diagnosis not present

## 2020-10-25 DIAGNOSIS — R059 Cough, unspecified: Secondary | ICD-10-CM | POA: Diagnosis not present

## 2020-10-25 DIAGNOSIS — Z20828 Contact with and (suspected) exposure to other viral communicable diseases: Secondary | ICD-10-CM | POA: Diagnosis not present

## 2020-10-25 DIAGNOSIS — R509 Fever, unspecified: Secondary | ICD-10-CM | POA: Diagnosis not present

## 2020-10-25 DIAGNOSIS — J189 Pneumonia, unspecified organism: Secondary | ICD-10-CM | POA: Diagnosis not present

## 2020-10-29 DIAGNOSIS — I10 Essential (primary) hypertension: Secondary | ICD-10-CM | POA: Diagnosis not present

## 2020-10-29 DIAGNOSIS — M19041 Primary osteoarthritis, right hand: Secondary | ICD-10-CM | POA: Diagnosis not present

## 2020-10-29 DIAGNOSIS — K219 Gastro-esophageal reflux disease without esophagitis: Secondary | ICD-10-CM | POA: Diagnosis not present

## 2020-10-29 DIAGNOSIS — D509 Iron deficiency anemia, unspecified: Secondary | ICD-10-CM | POA: Diagnosis not present

## 2020-10-29 DIAGNOSIS — E782 Mixed hyperlipidemia: Secondary | ICD-10-CM | POA: Diagnosis not present

## 2020-10-29 DIAGNOSIS — M810 Age-related osteoporosis without current pathological fracture: Secondary | ICD-10-CM | POA: Diagnosis not present

## 2020-10-31 DIAGNOSIS — J189 Pneumonia, unspecified organism: Secondary | ICD-10-CM | POA: Diagnosis not present

## 2021-01-07 DIAGNOSIS — I10 Essential (primary) hypertension: Secondary | ICD-10-CM | POA: Diagnosis not present

## 2021-01-07 DIAGNOSIS — D509 Iron deficiency anemia, unspecified: Secondary | ICD-10-CM | POA: Diagnosis not present

## 2021-01-07 DIAGNOSIS — M19041 Primary osteoarthritis, right hand: Secondary | ICD-10-CM | POA: Diagnosis not present

## 2021-01-07 DIAGNOSIS — K219 Gastro-esophageal reflux disease without esophagitis: Secondary | ICD-10-CM | POA: Diagnosis not present

## 2021-01-07 DIAGNOSIS — M810 Age-related osteoporosis without current pathological fracture: Secondary | ICD-10-CM | POA: Diagnosis not present

## 2021-01-07 DIAGNOSIS — E782 Mixed hyperlipidemia: Secondary | ICD-10-CM | POA: Diagnosis not present

## 2021-01-23 DIAGNOSIS — M720 Palmar fascial fibromatosis [Dupuytren]: Secondary | ICD-10-CM | POA: Diagnosis not present

## 2021-01-23 DIAGNOSIS — M2550 Pain in unspecified joint: Secondary | ICD-10-CM | POA: Diagnosis not present

## 2021-01-23 DIAGNOSIS — G2581 Restless legs syndrome: Secondary | ICD-10-CM | POA: Diagnosis not present

## 2021-01-23 DIAGNOSIS — Z23 Encounter for immunization: Secondary | ICD-10-CM | POA: Diagnosis not present

## 2021-01-23 DIAGNOSIS — E782 Mixed hyperlipidemia: Secondary | ICD-10-CM | POA: Diagnosis not present

## 2021-01-23 DIAGNOSIS — F419 Anxiety disorder, unspecified: Secondary | ICD-10-CM | POA: Diagnosis not present

## 2021-01-23 DIAGNOSIS — I10 Essential (primary) hypertension: Secondary | ICD-10-CM | POA: Diagnosis not present

## 2021-01-23 DIAGNOSIS — D509 Iron deficiency anemia, unspecified: Secondary | ICD-10-CM | POA: Diagnosis not present

## 2021-01-23 DIAGNOSIS — M810 Age-related osteoporosis without current pathological fracture: Secondary | ICD-10-CM | POA: Diagnosis not present

## 2021-01-23 DIAGNOSIS — N529 Male erectile dysfunction, unspecified: Secondary | ICD-10-CM | POA: Diagnosis not present

## 2021-01-23 DIAGNOSIS — E291 Testicular hypofunction: Secondary | ICD-10-CM | POA: Diagnosis not present

## 2021-01-23 DIAGNOSIS — K219 Gastro-esophageal reflux disease without esophagitis: Secondary | ICD-10-CM | POA: Diagnosis not present

## 2021-01-30 DIAGNOSIS — M720 Palmar fascial fibromatosis [Dupuytren]: Secondary | ICD-10-CM | POA: Diagnosis not present

## 2021-02-26 DIAGNOSIS — M810 Age-related osteoporosis without current pathological fracture: Secondary | ICD-10-CM | POA: Diagnosis not present

## 2021-02-26 DIAGNOSIS — D509 Iron deficiency anemia, unspecified: Secondary | ICD-10-CM | POA: Diagnosis not present

## 2021-02-26 DIAGNOSIS — I10 Essential (primary) hypertension: Secondary | ICD-10-CM | POA: Diagnosis not present

## 2021-02-26 DIAGNOSIS — Z8546 Personal history of malignant neoplasm of prostate: Secondary | ICD-10-CM | POA: Diagnosis not present

## 2021-02-26 DIAGNOSIS — M19041 Primary osteoarthritis, right hand: Secondary | ICD-10-CM | POA: Diagnosis not present

## 2021-02-26 DIAGNOSIS — E782 Mixed hyperlipidemia: Secondary | ICD-10-CM | POA: Diagnosis not present

## 2021-02-26 DIAGNOSIS — K219 Gastro-esophageal reflux disease without esophagitis: Secondary | ICD-10-CM | POA: Diagnosis not present

## 2021-03-05 DIAGNOSIS — M8588 Other specified disorders of bone density and structure, other site: Secondary | ICD-10-CM | POA: Diagnosis not present

## 2021-03-05 DIAGNOSIS — M810 Age-related osteoporosis without current pathological fracture: Secondary | ICD-10-CM | POA: Diagnosis not present

## 2021-03-10 DIAGNOSIS — C61 Malignant neoplasm of prostate: Secondary | ICD-10-CM | POA: Diagnosis not present

## 2021-06-09 DIAGNOSIS — E782 Mixed hyperlipidemia: Secondary | ICD-10-CM | POA: Diagnosis not present

## 2021-06-09 DIAGNOSIS — I10 Essential (primary) hypertension: Secondary | ICD-10-CM | POA: Diagnosis not present

## 2021-08-17 DIAGNOSIS — Z0000 Encounter for general adult medical examination without abnormal findings: Secondary | ICD-10-CM | POA: Diagnosis not present

## 2021-08-17 DIAGNOSIS — I10 Essential (primary) hypertension: Secondary | ICD-10-CM | POA: Diagnosis not present

## 2021-08-17 DIAGNOSIS — Z23 Encounter for immunization: Secondary | ICD-10-CM | POA: Diagnosis not present

## 2021-08-17 DIAGNOSIS — E291 Testicular hypofunction: Secondary | ICD-10-CM | POA: Diagnosis not present

## 2021-08-17 DIAGNOSIS — K219 Gastro-esophageal reflux disease without esophagitis: Secondary | ICD-10-CM | POA: Diagnosis not present

## 2021-08-17 DIAGNOSIS — G2581 Restless legs syndrome: Secondary | ICD-10-CM | POA: Diagnosis not present

## 2021-08-17 DIAGNOSIS — M810 Age-related osteoporosis without current pathological fracture: Secondary | ICD-10-CM | POA: Diagnosis not present

## 2021-08-17 DIAGNOSIS — Z1389 Encounter for screening for other disorder: Secondary | ICD-10-CM | POA: Diagnosis not present

## 2021-08-17 DIAGNOSIS — F419 Anxiety disorder, unspecified: Secondary | ICD-10-CM | POA: Diagnosis not present

## 2021-08-17 DIAGNOSIS — Z8546 Personal history of malignant neoplasm of prostate: Secondary | ICD-10-CM | POA: Diagnosis not present

## 2021-08-17 DIAGNOSIS — D509 Iron deficiency anemia, unspecified: Secondary | ICD-10-CM | POA: Diagnosis not present

## 2021-08-17 DIAGNOSIS — E782 Mixed hyperlipidemia: Secondary | ICD-10-CM | POA: Diagnosis not present

## 2022-01-03 NOTE — Progress Notes (Unsigned)
[place] at [facility] [address], [place], [place] [address] [phone]  Date:  01/04/2022   Name:  RANON COVEN   DOB:  1950/11/24   MRN:  [id number]  PCP:  Maury Dus, MD    Chief Complaint: No chief complaint on file.   History of Present Illness:  JACQUISE RARICK is a 71 y.o. very pleasant male patient who presents with the following:  Patient seen today to establish care.  His wife has been a long-term patient of mine, Jaleen' PCP is retiring soon Former patient of Dr. Herbie Baltimore Reade-I am not able to see his records in epic, we will request  There are no problems to display for this patient.   Past Medical History:  Diagnosis Date   GERD (gastroesophageal reflux disease)    Hypertension     No past surgical history on file.  Social History   Tobacco Use   Smoking status: Never  Substance Use Topics   Alcohol use: Yes   Drug use: No    No family history on file.  Allergies  Allergen Reactions   No Known Allergies     Medication list has been reviewed and updated.  Current Outpatient Medications on File Prior to Visit  Medication Sig Dispense Refill   Calcium-Vitamin D-Vitamin K (VIACTIV) [phone] MG-UNT-MCG CHEW Chew 1 tablet by mouth every morning.     diphenoxylate-atropine (LOMOTIL) 2.5-0.025 MG per tablet Take 2 tablets by mouth 4 (four) times daily as needed for diarrhea or loose stools. 30 tablet 0   Multiple Vitamin (MULTIVITAMIN WITH MINERALS) TABS tablet Take 1 tablet by mouth every morning.     pantoprazole (PROTONIX) 40 MG tablet Take 40 mg by mouth every morning.      potassium chloride 20 MEQ/15ML (10%) solution Take 30 mLs (40 mEq total) by mouth daily. 150 mL 0   pravastatin (PRAVACHOL) 40 MG tablet Take 40 mg by mouth every morning.      testosterone (ANDROGEL) 50 MG/5GM (1%) GEL Place 5 g onto the skin every morning.     valsartan-hydrochlorothiazide (DIOVAN-HCT) 320-12.5 MG  per tablet Take 1 tablet by mouth every morning.      venlafaxine (EFFEXOR) 37.5 MG tablet Take 37.5 mg by mouth 2 (two) times daily.     No current facility-administered medications on file prior to visit.    Review of Systems:  As per HPI- otherwise negative.   Physical Examination: There were no vitals filed for this visit. There were no vitals filed for this visit. There is no height or weight on file to calculate BMI. Ideal Body Weight:    GEN: no acute distress. HEENT: Atraumatic, Normocephalic.  Ears and Nose: No external deformity. CV: RRR, No M/G/R. No JVD. No thrill. No extra heart sounds. PULM: CTA B, no wheezes, crackles, rhonchi. No retractions. No resp. distress. No accessory muscle use. ABD: S, NT, ND, +BS. No rebound. No HSM. EXTR: No c/c/e PSYCH: Normally interactive. Conversant.    Assessment and Plan: ***  Signed Lamar Blinks, MD

## 2022-01-04 ENCOUNTER — Ambulatory Visit (INDEPENDENT_AMBULATORY_CARE_PROVIDER_SITE_OTHER): Payer: PPO | Admitting: Family Medicine

## 2022-01-04 ENCOUNTER — Encounter: Payer: Self-pay | Admitting: Family Medicine

## 2022-01-04 VITALS — BP 118/60 | HR 107 | Temp 97.8°F | Resp 18 | Ht 68.0 in | Wt 173.0 lb

## 2022-01-04 DIAGNOSIS — K219 Gastro-esophageal reflux disease without esophagitis: Secondary | ICD-10-CM | POA: Diagnosis not present

## 2022-01-04 DIAGNOSIS — Z130 Encounter for screening for diseases of the blood and blood-forming organs and certain disorders involving the immune mechanism: Secondary | ICD-10-CM | POA: Diagnosis not present

## 2022-01-04 DIAGNOSIS — R000 Tachycardia, unspecified: Secondary | ICD-10-CM | POA: Diagnosis not present

## 2022-01-04 DIAGNOSIS — E785 Hyperlipidemia, unspecified: Secondary | ICD-10-CM

## 2022-01-04 DIAGNOSIS — M19041 Primary osteoarthritis, right hand: Secondary | ICD-10-CM | POA: Diagnosis not present

## 2022-01-04 DIAGNOSIS — F419 Anxiety disorder, unspecified: Secondary | ICD-10-CM

## 2022-01-04 DIAGNOSIS — F32A Depression, unspecified: Secondary | ICD-10-CM | POA: Diagnosis not present

## 2022-01-04 DIAGNOSIS — I10 Essential (primary) hypertension: Secondary | ICD-10-CM

## 2022-01-04 DIAGNOSIS — M19042 Primary osteoarthritis, left hand: Secondary | ICD-10-CM | POA: Diagnosis not present

## 2022-01-04 DIAGNOSIS — Z8546 Personal history of malignant neoplasm of prostate: Secondary | ICD-10-CM | POA: Diagnosis not present

## 2022-01-04 MED ORDER — MELOXICAM 7.5 MG PO TABS: 7.5000 mg | ORAL_TABLET | Freq: Every day | ORAL | 1 refills | Status: DC

## 2022-01-04 NOTE — Patient Instructions (Addendum)
Good to see you again today!  We will get you seen by hand ortho in [facility] I will be in touch with your labs asap  Try the meloxicam as needed for hand pain but watch for elevation of BP or increased stomach symptoms!   Let me know if you need anything

## 2022-01-05 LAB — CBC
HCT: 41.8 % (ref 39.0–52.0)
Hemoglobin: 14.1 g/dL (ref 13.0–17.0)
MCHC: 33.8 g/dL (ref 30.0–36.0)
MCV: 88.2 fl (ref 78.0–100.0)
Platelets: 208 10*3/uL (ref 150.0–400.0)
RBC: 4.73 Mil/uL (ref 4.22–5.81)
RDW: 14.6 % (ref 11.5–15.5)
WBC: 6 10*3/uL (ref 4.0–10.5)

## 2022-01-05 LAB — COMPREHENSIVE METABOLIC PANEL
ALT: 28 U/L (ref 0–53)
AST: 32 U/L (ref 0–37)
Albumin: 4.4 g/dL (ref 3.5–5.2)
Alkaline Phosphatase: 61 U/L (ref 39–117)
BUN: 20 mg/dL (ref 6–23)
CO2: 29 mEq/L (ref 19–32)
Calcium: 9.5 mg/dL (ref 8.4–10.5)
Chloride: 101 mEq/L (ref 96–112)
Creatinine, Ser: 1.13 mg/dL (ref 0.40–1.50)
GFR: 66.02 mL/min (ref >60.00)
Glucose, Bld: 87 mg/dL (ref 70–99)
Potassium: 3.6 mEq/L (ref 3.5–5.1)
Sodium: 141 mEq/L (ref 135–145)
Total Bilirubin: 0.4 mg/dL (ref 0.2–1.2)
Total Protein: 7.1 g/dL (ref 6.0–8.3)

## 2022-01-05 LAB — LIPID PANEL
Cholesterol: 187 mg/dL (ref 0–200)
HDL: 86.4 mg/dL (ref >39.00)
LDL Cholesterol: 74 mg/dL (ref 0–99)
NonHDL: 100.57
Total CHOL/HDL Ratio: 2
Triglycerides: 131 mg/dL (ref 0.0–149.0)
VLDL: 26.2 mg/dL (ref 0.0–40.0)

## 2022-01-05 LAB — PSA: PSA: 0.06 ng/mL — ABNORMAL LOW (ref 0.10–4.00)

## 2022-01-05 LAB — TSH: TSH: 1.72 u[IU]/mL (ref 0.35–5.50)

## 2022-01-05 LAB — HEMOGLOBIN A1C: Hgb A1c MFr Bld: 6.3 % (ref 4.6–6.5)

## 2022-01-06 ENCOUNTER — Encounter: Payer: Self-pay | Admitting: Family Medicine

## 2022-01-08 ENCOUNTER — Encounter: Payer: Self-pay | Admitting: Family Medicine

## 2022-01-21 ENCOUNTER — Encounter: Payer: Self-pay | Admitting: Family Medicine

## 2022-01-29 DIAGNOSIS — M19041 Primary osteoarthritis, right hand: Secondary | ICD-10-CM | POA: Diagnosis not present

## 2022-01-29 DIAGNOSIS — M24542 Contracture, left hand: Secondary | ICD-10-CM | POA: Diagnosis not present

## 2022-01-29 DIAGNOSIS — M79644 Pain in right finger(s): Secondary | ICD-10-CM | POA: Diagnosis not present

## 2022-01-29 DIAGNOSIS — M158 Other polyosteoarthritis: Secondary | ICD-10-CM | POA: Diagnosis not present

## 2022-01-29 DIAGNOSIS — M20021 Boutonniere deformity of right finger(s): Secondary | ICD-10-CM | POA: Diagnosis not present

## 2022-02-09 ENCOUNTER — Encounter: Payer: Self-pay | Admitting: Family Medicine

## 2022-02-09 MED ORDER — ALBUTEROL SULFATE HFA 108 (90 BASE) MCG/ACT IN AERS: 1.0000 | INHALATION_SPRAY | Freq: Four times a day (QID) | RESPIRATORY_TRACT | 3 refills | Status: DC | PRN

## 2022-03-04 ENCOUNTER — Telehealth: Payer: Self-pay

## 2022-03-04 MED ORDER — PRAVASTATIN SODIUM 40 MG PO TABS: 40.0000 mg | ORAL_TABLET | Freq: Every morning | ORAL | 3 refills | Status: DC

## 2022-03-04 MED ORDER — VALSARTAN-HYDROCHLOROTHIAZIDE 320-12.5 MG PO TABS: 1.0000 | ORAL_TABLET | Freq: Every morning | ORAL | 3 refills | Status: DC

## 2022-03-04 MED ORDER — VENLAFAXINE HCL ER 150 MG PO CP24: 150.0000 mg | ORAL_CAPSULE | Freq: Every day | ORAL | 3 refills | Status: DC

## 2022-03-04 MED ORDER — ROPINIROLE HCL 1 MG PO TABS: ORAL_TABLET | ORAL | 3 refills | Status: DC

## 2022-03-04 MED ORDER — AMLODIPINE BESYLATE 5 MG PO TABS: 5.0000 mg | ORAL_TABLET | Freq: Every day | ORAL | 3 refills | Status: DC

## 2022-03-04 MED ORDER — PANTOPRAZOLE SODIUM 40 MG PO TBEC: 40.0000 mg | DELAYED_RELEASE_TABLET | Freq: Every morning | ORAL | 3 refills | Status: DC

## 2022-03-04 NOTE — Telephone Encounter (Signed)
error 

## 2022-04-06 ENCOUNTER — Other Ambulatory Visit: Payer: Self-pay | Admitting: Family Medicine

## 2022-04-06 DIAGNOSIS — M19041 Primary osteoarthritis, right hand: Secondary | ICD-10-CM

## 2022-04-09 ENCOUNTER — Encounter: Payer: Self-pay | Admitting: Family Medicine

## 2022-04-09 NOTE — Telephone Encounter (Signed)
Would you need to see him for this? Or are you okay with placing the referral?

## 2022-04-09 NOTE — Telephone Encounter (Signed)
Called patient discussed with him.  I am worried this could represent a detached retina.  He is a patient of [facility], he reports they are actually open Saturdays and Sundays.  I asked him to go ahead and give him a call and see if he can make an appointment for the next 1 to 2 days.  If this is not possible, it would be reasonable to have him go through the emergency room so he can be seen by ophthalmologist on-call.  He will call his optometrist and let me know what happens

## 2022-04-10 ENCOUNTER — Encounter (HOSPITAL_BASED_OUTPATIENT_CLINIC_OR_DEPARTMENT_OTHER): Payer: Self-pay | Admitting: Emergency Medicine

## 2022-04-10 ENCOUNTER — Emergency Department (HOSPITAL_BASED_OUTPATIENT_CLINIC_OR_DEPARTMENT_OTHER): Payer: PPO

## 2022-04-10 ENCOUNTER — Emergency Department (HOSPITAL_BASED_OUTPATIENT_CLINIC_OR_DEPARTMENT_OTHER)
Admission: EM | Admit: 2022-04-10 | Discharge: 2022-04-10 | Disposition: A | Discharge status: Home / Self Care | Payer: PPO | Attending: Emergency Medicine | Admitting: Emergency Medicine

## 2022-04-10 ENCOUNTER — Other Ambulatory Visit: Payer: Self-pay

## 2022-04-10 DIAGNOSIS — Z79899 Other long term (current) drug therapy: Secondary | ICD-10-CM | POA: Insufficient documentation

## 2022-04-10 DIAGNOSIS — I10 Essential (primary) hypertension: Secondary | ICD-10-CM | POA: Diagnosis not present

## 2022-04-10 DIAGNOSIS — H5462 Unqualified visual loss, left eye, normal vision right eye: Secondary | ICD-10-CM | POA: Insufficient documentation

## 2022-04-10 DIAGNOSIS — J320 Chronic maxillary sinusitis: Secondary | ICD-10-CM | POA: Diagnosis not present

## 2022-04-10 DIAGNOSIS — Z8546 Personal history of malignant neoplasm of prostate: Secondary | ICD-10-CM | POA: Insufficient documentation

## 2022-04-10 DIAGNOSIS — H538 Other visual disturbances: Secondary | ICD-10-CM | POA: Diagnosis not present

## 2022-04-10 LAB — CBC
HCT: 43.6 % (ref 39.0–52.0)
Hemoglobin: 14.6 g/dL (ref 13.0–17.0)
MCH: 29.9 pg (ref 26.0–34.0)
MCHC: 33.5 g/dL (ref 30.0–36.0)
MCV: 89.2 fL (ref 80.0–100.0)
Platelets: 164 10*3/uL (ref 150–400)
RBC: 4.89 MIL/uL (ref 4.22–5.81)
RDW: 12.2 % (ref 11.5–15.5)
WBC: 6.8 10*3/uL (ref 4.0–10.5)
nRBC: 0 % (ref 0.0–0.2)

## 2022-04-10 LAB — COMPREHENSIVE METABOLIC PANEL
ALT: 32 U/L (ref 0–44)
AST: 34 U/L (ref 15–41)
Albumin: 4.2 g/dL (ref 3.5–5.0)
Alkaline Phosphatase: 60 U/L (ref 38–126)
Anion gap: 8 (ref 5–15)
BUN: 20 mg/dL (ref 8–23)
CO2: 31 mmol/L (ref 22–32)
Calcium: 9.1 mg/dL (ref 8.9–10.3)
Chloride: 99 mmol/L (ref 98–111)
Creatinine, Ser: 0.76 mg/dL (ref 0.61–1.24)
GFR, Estimated: >60 mL/min (ref >60)
Glucose, Bld: 87 mg/dL (ref 70–99)
Potassium: 3.6 mmol/L (ref 3.5–5.1)
Sodium: 138 mmol/L (ref 135–145)
Total Bilirubin: 0.7 mg/dL (ref 0.3–1.2)
Total Protein: 7.6 g/dL (ref 6.5–8.1)

## 2022-04-10 MED ORDER — IOHEXOL 300 MG/ML  SOLN
75.0000 mL | Freq: Once | INTRAMUSCULAR | Status: AC | PRN
  Administered 2022-04-10: 75 mL via INTRAVENOUS

## 2022-04-10 NOTE — ED Notes (Signed)
PA informed of BP 144/116

## 2022-04-10 NOTE — ED Provider Notes (Signed)
[facility] Provider Note   CSN: [id number] Arrival date & time: 04/10/22  1020     History Past medical history includes hypertension, hyperlipidemia, history of prostate cancer, GERD, anxiety and depression Chief Complaint  Patient presents with   Eye Problem    Logan Wong is a 71 y.o. male.  Patient presents the emergency department the chief complaint of left eye vision loss.  He says 2 days ago he noticed some blurry vision that was present in the center of his visual field.  He says that this is persisted and yesterday started noticing floaters.  The floaters come and go.  He talked to his primary care doctor who was concerned for retinal detachment and he was supposed to see his optometrist today, however the vision got worse this morning when he woke up and he was advised to come to the emergency department.  He denies any pain with the eye, no foreign body sensation or any recent trauma, no headache, no redness of the eye, no history of stroke, no numbness or weakness of extremities, no speech changes, no gait abnormalities, no chest pain or shortness of breath, no fevers or chills.   Eye Problem      Home Medications Prior to Admission medications   Medication Sig Start Date End Date Taking? Authorizing Provider  albuterol (VENTOLIN HFA) 108 (90 Base) MCG/ACT inhaler Inhale 1-2 puffs into the lungs every 6 (six) hours as needed for wheezing or shortness of breath. 02/09/22   Copland, Gay Filler, MD  amLODipine (NORVASC) 5 MG tablet Take 1 tablet (5 mg total) by mouth daily. 03/04/22   Copland, Gay Filler, MD  Calcium-Vitamin D-Vitamin K (VIACTIV) 403-474-25 MG-UNT-MCG CHEW Chew 1 tablet by mouth every morning.    [provider]  diclofenac Sodium (VOLTAREN) 1 % GEL     [provider]  ibandronate (BONIVA) 150 MG tablet Take 150 mg by mouth every 30 (thirty) days. Take in the morning with a full glass of water, on an empty  stomach, and do not take anything else by mouth or lie down for the next 30 min.    [provider]  meloxicam (MOBIC) 7.5 MG tablet Take 1 tablet (7.5 mg total) by mouth daily. Use as needed for hand pain 01/04/22   Copland, Gay Filler, MD  Multiple Vitamin (MULTIVITAMIN WITH MINERALS) TABS tablet Take 1 tablet by mouth every morning.    [provider]  pantoprazole (PROTONIX) 40 MG tablet Take 1 tablet (40 mg total) by mouth every morning. 03/04/22   Copland, Gay Filler, MD  pravastatin (PRAVACHOL) 40 MG tablet Take 1 tablet (40 mg total) by mouth every morning. 03/04/22   Copland, Gay Filler, MD  rOPINIRole (REQUIP) 1 MG tablet Take 1 tablet by mouth 1-3 hours before bedtime 03/04/22   Copland, Gay Filler, MD  tadalafil (CIALIS) 5 MG tablet Take 5 mg by mouth daily as needed for erectile dysfunction.    [provider]  valsartan-hydrochlorothiazide (DIOVAN-HCT) 320-12.5 MG tablet Take 1 tablet by mouth every morning. 03/04/22   Copland, Gay Filler, MD  venlafaxine XR (EFFEXOR-XR) 150 MG 24 hr capsule Take 1 capsule (150 mg total) by mouth daily with breakfast. 03/04/22   Copland, Gay Filler, MD      Allergies    Bee pollen    Review of Systems   Review of Systems  Eyes:  Positive for visual disturbance.  All other systems reviewed and are negative.  Physical Exam Updated Vital Signs BP (!) 144/116 (BP Location: Right Arm) Comment: Taken 2X  Pulse 97   Temp 98 F (36.7 C) (Oral)   Resp 18   Wt 76.2 kg   SpO2 94%   BMI 25.54 kg/m  Physical Exam Vitals and nursing note reviewed.  Constitutional:      General: He is not in acute distress.    Appearance: Normal appearance. He is well-developed. He is not ill-appearing, toxic-appearing or diaphoretic.  HENT:     Head: Normocephalic and atraumatic.     Nose: No nasal deformity.     Mouth/Throat:     Lips: Pink. No lesions.  Eyes:     General: Gaze aligned appropriately. No visual field deficit or scleral  icterus.       Right eye: No foreign body or discharge.        Left eye: No foreign body or discharge.     Extraocular Movements: Extraocular movements intact.     Right eye: Normal extraocular motion and no nystagmus.     Left eye: Normal extraocular motion and no nystagmus.     Conjunctiva/sclera: Conjunctivae normal.     Right eye: Right conjunctiva is not injected. No chemosis, exudate or hemorrhage.    Left eye: Left conjunctiva is not injected. No chemosis, exudate or hemorrhage.    Pupils: Pupils are equal, round, and reactive to light.     Funduscopic exam:       Left eye: Red reflex present.    Slit lamp exam:    Left eye: No photophobia.     Comments: Right eye 20/25, left eye 20/40  Pulmonary:     Effort: Pulmonary effort is normal. No respiratory distress.  Skin:    General: Skin is warm and dry.  Neurological:     Mental Status: He is alert and oriented to person, place, and time.  Psychiatric:        Mood and Affect: Mood normal.        Speech: Speech normal.        Behavior: Behavior normal. Behavior is cooperative.     ED Results / Procedures / Treatments   Labs (all labs ordered are listed, but only abnormal results are displayed) Labs Reviewed  CBC  COMPREHENSIVE METABOLIC PANEL    EKG None  Radiology CT Orbits W Contrast  Result Date: 04/10/2022 CLINICAL DATA:  71 year old male with blurred vision, vision changes for 2 days. Suspicion of detached retina. EXAM: CT ORBITS WITH CONTRAST TECHNIQUE: Multidetector CT images was performed according to the standard protocol following intravenous contrast administration. RADIATION DOSE REDUCTION: This exam was performed according to the departmental dose-optimization program which includes automated exposure control, adjustment of the mA and/or kV according to patient size and/or use of iterative reconstruction technique. CONTRAST:  67m OMNIPAQUE IOHEXOL 300 MG/ML  SOLN COMPARISON:  None Available. FINDINGS:  Orbits: Normal suprasellar cistern. Unremarkable CT appearance of the optic chiasm and cavernous sinus. No intraorbital mass or inflammation identified. Symmetric and unremarkable CT appearance of the orbits soft tissues. Globes also appears symmetric and within normal limits. No vitreous heterogeneity or architectural distortion identified. No abnormal enhancement identified. Visible paranasal sinuses: Right maxillary alveolar recess opacification due to mucosal thickening and/or retention cyst. But otherwise paranasal sinuses and mastoids are clear. Tympanic cavities are clear. Soft tissues: Negative visible pharynx, parapharyngeal spaces, retropharyngeal space, masticator and parotid spaces. Visualized scalp soft tissues are within normal limits. Major vascular structures at the skull base are  enhancing and patent. Calcified atherosclerosis at the skull base. Dominant distal left vertebral artery, normal variant. Osseous: Congenital incomplete ossification of the posterior C1 ring and superimposed advanced degeneration at the anterior C1-odontoid with joint space loss, osteophytosis, subchondral sclerosis. Advanced chronic bilateral TMJ degeneration. No acute or suspicious osseous lesion identified. Limited intracranial: Negative. IMPRESSION: 1. Negative CT appearance of the orbits. Recommend correlation with fundoscopic exam. 2. Right maxillary sinus disease.  TMJ and C1-C2 degeneration. Electronically Signed   By: Genevie Ann M.D.   On: 04/10/2022 15:39    Procedures Ultrasound ED Ocular  Date/Time: 04/10/2022 4:37 PM  Performed by: Adolphus Birchwood, PA-C Authorized by: Adolphus Birchwood, PA-C   PROCEDURE DETAILS:    Indications: visual change     Assessed:  Left eye   Left eye axial view: obtained     Left eye saggital view: obtained     Images: not archived     Limitations:  None LEFT EYE FINDINGS:     no foreign body noted in left eye    left eye lens not dislodged    no left eye increased  optic nerve sheath diameter    no retrobulbar hematoma in left eye    evidence of retinal detachment of the left eye    no ruptured globe in left eye    no vitreous hemorrhage in left eye    Medications Ordered in ED Medications  iohexol (OMNIPAQUE) 300 MG/ML solution 75 mL (75 mLs Intravenous Contrast Given 04/10/22 1521)    ED Course/ Medical Decision Making/ A&P Clinical Course as of 04/10/22 1637  Sat Apr 10, 2022  1515 Ocular [place] notable for likely retinal detachment, no sign of retinal hemorrhage. Optic nerve appears normal [GL]    Clinical Course User Index [GL] Sherre Poot Adora Fridge, PA-C                           Medical Decision Making Amount and/or Complexity of Data Reviewed Labs: ordered. Radiology: ordered.  Risk Prescription drug management.    MDM  This is a 71 y.o. male who presents to the ED with left eye blurry vision with associated floaters for the past 2 days.  The differential of this patient includes but is not limited to CRAO, CRVO, Glaucoma, Retinal detachment, atrial hemorrhage, amaurosis fugax.  Initial Impression  Patient is well-appearing in no acute distress.  His vitals are stable. He did have slightly worsened vision in the left eye measured 20/40.  He has no associated pain or any pupillary abnormalities making acute angle glaucoma much less likely.  No other neurological deficits, and symptoms do not seem consistent with amaurosis fugax so doubt central etiology.  I personally ordered, reviewed, and interpreted all laboratory work and imaging and agree with radiologist interpretation. Results interpreted below:  Basic labs are unremarkable. CT orbit is negative Bedside ocular ultrasound notable for likely small retinal detachment.  Normal optic disc, lens, no evidence of any hemorrhage  Assessment/Plan:  Based on findings, patient likely has a retinal detachment as occlusion has been ruled out.  He is given information for ophthalmology.  He  should call them on Monday to schedule follow-up visit.  He can return with worsening symptoms.   Charting Requirements Additional history is obtained from:  Independent historian External Records from outside source obtained and reviewed including: Recent telephone notes Social Determinants of Health:  none Pertinant PMH that complicates patient's illness: Hypertension  Patient Care  Problems that were addressed during this visit: - Vision Loss of left eye: Acute illness with complication Medications given in ED: n/a Reevaluation of the patient after these medicines showed that the patient stayed the same I have reviewed home medications and made changes accordingly.  Critical Care Interventions: n/a Consultations: n/a Disposition: discharge  This is a shared visit with my attending physician, Dr. Pearline Cables.  We have discussed this patient and they have independently evaluated this patient. The plan was altered or changed as needed.  Portions of this note were generated with [employer]. Dictation errors may occur despite best attempts at proofreading.    Final Clinical Impression(s) / ED Diagnoses Final diagnoses:  Vision loss of left eye    Rx / DC Orders ED Discharge Orders     None         Adolphus Birchwood, PA-C [id number]    Gray, [place], DO 15/17/61 [phone]

## 2022-04-10 NOTE — Discharge Instructions (Signed)
You were seen in our ED with left eye vision loss.  Your ultrasound was concerning for possible retinal detachment.  Your CT of her orbits did not show any central occlusion.  You need to schedule an appointment with ophthalmology.  Dr. Gillian Scarce phone number information is included in your paperwork.  You should call them first thing Monday morning to schedule your follow-up visit.

## 2022-04-10 NOTE — ED Triage Notes (Signed)
Pt arrives pov, steady gait, c/o LT eye problem, concern for detached retina. Endorses blurred vision and floaters x 2 days

## 2022-04-11 ENCOUNTER — Encounter: Payer: Self-pay | Admitting: Family Medicine

## 2022-04-11 DIAGNOSIS — H3320 Serous retinal detachment, unspecified eye: Secondary | ICD-10-CM

## 2022-04-12 DIAGNOSIS — H43812 Vitreous degeneration, left eye: Secondary | ICD-10-CM | POA: Diagnosis not present

## 2022-04-12 DIAGNOSIS — H2513 Age-related nuclear cataract, bilateral: Secondary | ICD-10-CM | POA: Diagnosis not present

## 2022-04-12 DIAGNOSIS — H40053 Ocular hypertension, bilateral: Secondary | ICD-10-CM | POA: Diagnosis not present

## 2022-04-12 NOTE — Telephone Encounter (Signed)
[facility] called and advised the same thing. Pt is needing to see a retina specialist, as soon as possible.

## 2022-04-12 NOTE — Telephone Encounter (Signed)
Called pt- he states he actually saw Dr Katy Apo this am who noted he has a vitreous detachment which will resolve and he does not need to see retina.  Great news.

## 2022-06-07 ENCOUNTER — Other Ambulatory Visit: Payer: Self-pay | Admitting: Family Medicine

## 2022-06-07 DIAGNOSIS — M19041 Primary osteoarthritis, right hand: Secondary | ICD-10-CM

## 2022-08-23 ENCOUNTER — Encounter: Payer: Self-pay | Admitting: Family Medicine

## 2022-08-23 MED ORDER — TADALAFIL 5 MG PO TABS: 5.0000 mg | ORAL_TABLET | Freq: Every day | ORAL | 3 refills | Status: DC | PRN

## 2022-11-23 ENCOUNTER — Ambulatory Visit: Payer: PPO | Admitting: Emergency Medicine

## 2022-11-23 VITALS — Ht 69.0 in | Wt 173.0 lb

## 2022-11-23 DIAGNOSIS — Z1211 Encounter for screening for malignant neoplasm of colon: Secondary | ICD-10-CM

## 2022-11-23 DIAGNOSIS — Z0000 Encounter for general adult medical examination without abnormal findings: Secondary | ICD-10-CM | POA: Diagnosis not present

## 2022-11-23 DIAGNOSIS — Z1212 Encounter for screening for malignant neoplasm of rectum: Secondary | ICD-10-CM | POA: Diagnosis not present

## 2022-11-23 NOTE — Progress Notes (Signed)
Subjective:  Per patient no change in vitals since last visit, unable to obtain new vitals due to telehealth visit   Logan Wong is a 72 y.o. male who presents for Medicare Annual/Subsequent preventive examination.  Visit Complete: Virtual  I connected with  Logan Wong on 11/23/22 by a audio enabled telemedicine application and verified that I am speaking with the correct person using two identifiers.  Patient Location: Home  Provider Location: Home Office  I discussed the limitations of evaluation and management by telemedicine. The patient expressed understanding and agreed to proceed.  Patient Medicare AWV questionnaire was completed by the patient on 11/22/22; I have confirmed that all information answered by patient is correct and no changes since this date.  Review of Systems     Cardiac Risk Factors include: advanced age (>35men, >54 women);male gender;dyslipidemia;hypertension     Objective:    Today's Vitals   11/23/22 1401  Weight: 173 lb (78.5 kg)  Height: 5\' 9"  (1.753 m)   Body mass index is 25.55 kg/m.     11/23/2022    2:19 PM 04/10/2022   11:19 AM 01/04/2022    3:23 PM  Advanced Directives  Does Patient Have a Medical Advance Directive? Yes No Yes  Type of Estate agent of [place];Living will  Out of facility DNR (pink MOST or yellow form);Living will;Healthcare Power of Attorney  Does patient want to make changes to medical advance directive? No - Patient declined  No - Patient declined  Copy of Healthcare Power of Attorney in Chart? No - copy requested  No - copy requested    Current Medications (verified) Outpatient Encounter Medications as of 11/23/2022  Medication Sig   albuterol (VENTOLIN HFA) 108 (90 Base) MCG/ACT inhaler Inhale 1-2 puffs into the lungs every 6 (six) hours as needed for wheezing or shortness of breath.   amLODipine (NORVASC) 5 MG tablet Take 1 tablet (5 mg total) by mouth daily.   Calcium-Vitamin  D-Vitamin K (VIACTIV) 500-500-40 MG-UNT-MCG CHEW Chew 1 tablet by mouth every morning.   diclofenac Sodium (VOLTAREN) 1 % GEL    ibandronate (BONIVA) 150 MG tablet Take 150 mg by mouth every 30 (thirty) days. Take in the morning with a full glass of water, on an empty stomach, and do not take anything else by mouth or lie down for the next 30 min.   meloxicam (MOBIC) 7.5 MG tablet Take 1 tablet by mouth daily . Use as needed for hand pain   Multiple Vitamin (MULTIVITAMIN WITH MINERALS) TABS tablet Take 1 tablet by mouth every morning.   pantoprazole (PROTONIX) 40 MG tablet Take 1 tablet (40 mg total) by mouth every morning.   pravastatin (PRAVACHOL) 40 MG tablet Take 1 tablet (40 mg total) by mouth every morning.   rOPINIRole (REQUIP) 1 MG tablet Take 1 tablet by mouth 1-3 hours before bedtime   tadalafil (CIALIS) 5 MG tablet Take 1 tablet (5 mg total) by mouth daily as needed for erectile dysfunction.   valsartan-hydrochlorothiazide (DIOVAN-HCT) 320-12.5 MG tablet Take 1 tablet by mouth every morning.   venlafaxine XR (EFFEXOR-XR) 150 MG 24 hr capsule Take 1 capsule (150 mg total) by mouth daily with breakfast.   No facility-administered encounter medications on file as of 11/23/2022.    Allergies (verified) Bee pollen   History: Past Medical History:  Diagnosis Date   Allergies    Anxiety    Cancer (HCC)    Depression    GERD (gastroesophageal reflux disease)  Heart murmur    Hyperlipidemia    Hypertension    Osteoarthritis    Osteoporosis    Past Surgical History:  Procedure Laterality Date   PROSTATECTOMY  2019   ROTATOR CUFF REPAIR Right 2010   THULIUM LASER TURP (TRANSURETHRAL RESECTION OF PROSTATE)  2007   Family History  Problem Relation Age of Onset   Hypertension Mother    Osteoarthritis Mother    COPD Mother    Heart attack Mother    Heart attack Father    COPD Father    Cancer Father    Cancer Sister    Early death Sister    Early death Sister    Social  History   Socioeconomic History   Marital status: Married    Spouse name: Ann   Number of children: Not on file   Years of education: Not on file   Highest education level: Not on file  Occupational History   Occupation: retired  Tobacco Use   Smoking status: Former    Current packs/day: 0.00    Average packs/day: 1 pack/day for 12.0 years (12.0 ttl pk-yrs)    Types: Cigarettes    Start date: 89    Quit date: 1986    Years since quitting: 38.5   Smokeless tobacco: Never  Vaping Use   Vaping status: Never Used  Substance and Sexual Activity   Alcohol use: Yes    Alcohol/week: 4.0 standard drinks of alcohol    Types: 2 Glasses of wine, 2 Shots of liquor per week   Drug use: No   Sexual activity: Not on file  Other Topics Concern   Not on file  Social History Narrative   Patient is married with 2 step-sons   Social Determinants of Health   Financial Resource Strain: Low Risk  (11/22/2022)   Overall Financial Resource Strain (CARDIA)    Difficulty of Paying Living Expenses: Not very hard  Food Insecurity: No Food Insecurity (11/22/2022)   Hunger Vital Sign    Worried About Running Out of Food in the Last Year: Never true    Ran Out of Food in the Last Year: Never true  Transportation Needs: No Transportation Needs (11/22/2022)   PRAPARE - Administrator, Civil Service (Medical): No    Lack of Transportation (Non-Medical): No  Physical Activity: Insufficiently Active (11/22/2022)   Exercise Vital Sign    Days of Exercise per Week: 2 days    Minutes of Exercise per Session: 20 min  Stress: No Stress Concern Present (11/22/2022)   [employer] of Occupational Health - Occupational Stress Questionnaire    Feeling of Stress : Not at all  Social Connections: Unknown (11/22/2022)   Social Connection and Isolation Panel [NHANES]    Frequency of Communication with Friends and Family: Once a week    Frequency of Social Gatherings with Friends and Family: Patient  declined    Attends Religious Services: More than 4 times per year    Active Member of [employer] or Organizations: Yes    Attends Banker Meetings: 1 to 4 times per year    Marital Status: Married    Tobacco Counseling Counseling given: Not Answered   Clinical Intake:  Pre-visit preparation completed: Yes  Pain : No/denies pain     BMI - recorded: 25.55 Nutritional Status: BMI 25 -29 Overweight Nutritional Risks: None Diabetes: No  How often do you need to have someone help you when you read instructions, pamphlets, or other written  materials from your doctor or pharmacy?: 1 - Never  Interpreter Needed?: No  Information entered by :: Tora Kindred, CMA   Activities of Daily Living    11/22/2022    2:27 PM  In your present state of health, do you have any difficulty performing the following activities:  Hearing? 0  Vision? 0  Difficulty concentrating or making decisions? 0  Walking or climbing stairs? 0  Dressing or bathing? 0  Doing errands, shopping? 0  Preparing Food and eating ? N  Using the Toilet? N  In the past six months, have you accidently leaked urine? N  Do you have problems with loss of bowel control? N  Managing your Medications? N  Managing your Finances? N  Housekeeping or managing your Housekeeping? N    Patient Care Team: Copland, Gwenlyn Found, MD as PCP - General (Family Medicine)  Indicate any recent Medical Services you may have received from other than [facility] providers in the past year (date may be approximate).     Assessment:   This is a routine wellness examination for [place].  Hearing/Vision screen Hearing Screening - Comments:: Denies hearing loss  Dietary issues and exercise activities discussed:     Goals Addressed             This Visit's Progress    Weight (lb) < 165 lb (74.8 kg)   173 lb (78.5 kg)     Depression Screen    11/23/2022    2:14 PM 01/04/2022    3:34 PM  PHQ 2/9 Scores  PHQ - 2 Score 0 0   PHQ- 9 Score 0     Fall Risk    11/22/2022    2:27 PM 01/04/2022    3:34 PM  Fall Risk   Falls in the past year? 0 0  Number falls in past yr: 0 0  Injury with Fall? 0 0  Risk for fall due to : No Fall Risks   Follow up Falls prevention discussed     MEDICARE RISK AT HOME:  Medicare Risk at Home - 11/22/22 1427     Any stairs in or around the home? Yes   1 step into house   If so, are there any without handrails? Yes             TIMED UP AND GO:  Was the test performed?  No    Cognitive Function:        11/23/2022    2:22 PM  6CIT Screen  What Year? 0 points  What month? 0 points  What time? 0 points  Count back from 20 0 points  Months in reverse 0 points  Repeat phrase 0 points  Total Score 0 points    Immunizations Immunization History  Administered Date(s) Administered   Influenza-Unspecified 01/14/2022   PFIZER(Purple Top)SARS-COV-2 Vaccination 06/01/2019, 06/22/2019, 03/24/2020   Pneumococcal Conjugate-13 11/14/2017   Pneumococcal Polysaccharide-23 08/17/2021   Tdap 03/30/2012   Zoster Recombinant(Shingrix) 07/31/2020, 01/23/2021    TDAP status: Due, Education has been provided regarding the importance of this vaccine. Advised may receive this vaccine at local pharmacy or Health Dept. Aware to provide a copy of the vaccination record if obtained from local pharmacy or Health Dept. Verbalized acceptance and understanding.  Flu Vaccine status: Up to date  Pneumococcal vaccine status: Up to date  Covid-19 vaccine status: Completed vaccines  Qualifies for Shingles Vaccine? Yes   Zostavax completed No   Shingrix Completed?: Yes  Screening Tests Health Maintenance  Topic Date Due   Hepatitis C Screening  Never done   Colonoscopy  Never done   COVID-19 Vaccine (4 - 2023-24 season) 01/08/2022   DTaP/Tdap/Td (2 - Td or Tdap) 03/30/2022   INFLUENZA VACCINE  12/09/2022   Medicare Annual Wellness (AWV)  11/23/2023   Pneumonia Vaccine 65+ Years  old  Completed   Zoster Vaccines- Shingrix  Completed   HPV VACCINES  Aged Out    Health Maintenance  Health Maintenance Due  Topic Date Due   Hepatitis C Screening  Never done   Colonoscopy  Never done   COVID-19 Vaccine (4 - 2023-24 season) 01/08/2022   DTaP/Tdap/Td (2 - Td or Tdap) 03/30/2022    Colorectal cancer screening: Referral to GI placed 11/23/22. Pt aware the office will call re: appt.  Lung Cancer Screening: (Low Dose CT Chest recommended if Age 54-80 years, 20 pack-year currently smoking OR have quit w/in 15years.) does not qualify.   Lung Cancer Screening Referral: n/a  Additional Screening:  Hepatitis C Screening: does qualify; Will have with next labs  Vision Screening: Recommended annual ophthalmology exams for early detection of glaucoma and other disorders of the eye. Is the patient up to date with their annual eye exam?  No  Who is the provider or what is the name of the office in which the patient attends annual eye exams? 2022 If pt is not established with a provider, would they like to be referred to a provider to establish care? No .   Dental Screening: Recommended annual dental exams for proper oral hygiene  Diabetic Foot Exam: n/a  Community Resource Referral / Chronic Care Management: CRR required this visit?  No   CCM required this visit?  No     Plan:     I have personally reviewed and noted the following in the patient's chart:   Medical and social history Use of alcohol, tobacco or illicit drugs  Current medications and supplements including opioid prescriptions. Patient is not currently taking opioid prescriptions. Functional ability and status Nutritional status Physical activity Advanced directives List of other physicians Hospitalizations, surgeries, and ER visits in previous 12 months Vitals Screenings to include cognitive, depression, and falls Referrals and appointments  In addition, I have reviewed and discussed with  patient certain preventive protocols, quality metrics, and best practice recommendations. A written personalized care plan for preventive services as well as general preventive health recommendations were provided to patient.     Tora Kindred, CMA   11/23/2022   After Visit Summary: (MyChart) Due to this being a telephonic visit, the after visit summary with patients personalized plan was offered to patient via MyChart   Nurse Notes:  Referral placed for GI for colon cancer screening. Patient needs Hep C screening with next labs. Patient needs Tdap at next OV. Patient has been instructed to call [facility] to schedule eye exam.

## 2022-11-23 NOTE — Patient Instructions (Addendum)
Mr. Files , Thank you for taking time to come for your Medicare Wellness Visit. I appreciate your ongoing commitment to your health goals. Please review the following plan we discussed and let me know if I can assist you in the future.   These are the goals we discussed:  Goals      Weight (lb) < 165 lb (74.8 kg)        This is a list of the screening recommended for you and due dates:  Health Maintenance  Topic Date Due   Hepatitis C Screening  Never done   Colon Cancer Screening  Never done   COVID-19 Vaccine (4 - 2023-24 season) 01/08/2022   DTaP/Tdap/Td vaccine (2 - Td or Tdap) 03/30/2022   Flu Shot  12/09/2022   Medicare Annual Wellness Visit  11/23/2023   Pneumonia Vaccine  Completed   Zoster (Shingles) Vaccine  Completed   HPV Vaccine  Aged Out    Advanced directives: Please bring a copy of your health care power of attorney and living will to the office to be added to your chart at your convenience.   Conditions/risks identified: GI will be calling to schedule appointment for colonoscopy. Need tetanus and Hep C screening at your next visit. Call [facility] to schedule your eye exam as discussed.  Next appointment: Follow up in one year for your annual wellness visit. 11/29/23  Preventive Care 65 Years and Older, Male  Preventive care refers to lifestyle choices and visits with your health care provider that can promote health and wellness. What does preventive care include? A yearly physical exam. This is also called an annual well check. Dental exams once or twice a year. Routine eye exams. Ask your health care provider how often you should have your eyes checked. Personal lifestyle choices, including: Daily care of your teeth and gums. Regular physical activity. Eating a healthy diet. Avoiding tobacco and drug use. Limiting alcohol use. Practicing safe sex. Taking low doses of aspirin every day. Taking vitamin and mineral supplements as recommended by your  health care provider. What happens during an annual well check? The services and screenings done by your health care provider during your annual well check will depend on your age, overall health, lifestyle risk factors, and family history of disease. Counseling  Your health care provider may ask you questions about your: Alcohol use. Tobacco use. Drug use. Emotional well-being. Home and relationship well-being. Sexual activity. Eating habits. History of falls. Memory and ability to understand (cognition). Work and work Astronomer. Screening  You may have the following tests or measurements: Height, weight, and BMI. Blood pressure. Lipid and cholesterol levels. These may be checked every 5 years, or more frequently if you are over 14 years old. Skin check. Lung cancer screening. You may have this screening every year starting at age 78 if you have a 30-pack-year history of smoking and currently smoke or have quit within the past 15 years. Fecal occult blood test (FOBT) of the stool. You may have this test every year starting at age 66. Flexible sigmoidoscopy or colonoscopy. You may have a sigmoidoscopy every 5 years or a colonoscopy every 10 years starting at age 41. Prostate cancer screening. Recommendations will vary depending on your family history and other risks. Hepatitis C blood test. Hepatitis B blood test. Sexually transmitted disease (STD) testing. Diabetes screening. This is done by checking your blood sugar (glucose) after you have not eaten for a while (fasting). You may have this done  every 1-3 years. Abdominal aortic aneurysm (AAA) screening. You may need this if you are a current or former smoker. Osteoporosis. You may be screened starting at age 16 if you are at high risk. Talk with your health care provider about your test results, treatment options, and if necessary, the need for more tests. Vaccines  Your health care provider may recommend certain vaccines, such  as: Influenza vaccine. This is recommended every year. Tetanus, diphtheria, and acellular pertussis (Tdap, Td) vaccine. You may need a Td booster every 10 years. Zoster vaccine. You may need this after age 37. Pneumococcal 13-valent conjugate (PCV13) vaccine. One dose is recommended after age 16. Pneumococcal polysaccharide (PPSV23) vaccine. One dose is recommended after age 16. Talk to your health care provider about which screenings and vaccines you need and how often you need them. This information is not intended to replace advice given to you by your health care provider. Make sure you discuss any questions you have with your health care provider. Document Released: 05/23/2015 Document Revised: 01/14/2016 Document Reviewed: 02/25/2015 Elsevier Interactive Patient Education  2017 [employer].  Fall Prevention in the Home Falls can cause injuries. They can happen to people of all ages. There are many things you can do to make your home safe and to help prevent falls. What can I do on the outside of my home? Regularly fix the edges of walkways and driveways and fix any cracks. Remove anything that might make you trip as you walk through a door, such as a raised step or threshold. Trim any bushes or trees on the path to your home. Use bright outdoor lighting. Clear any walking paths of anything that might make someone trip, such as rocks or tools. Regularly check to see if handrails are loose or broken. Make sure that both sides of any steps have handrails. Any raised decks and porches should have guardrails on the edges. Have any leaves, snow, or ice cleared regularly. Use sand or salt on walking paths during winter. Clean up any spills in your garage right away. This includes oil or grease spills. What can I do in the bathroom? Use night lights. Install grab bars by the toilet and in the tub and shower. Do not use towel bars as grab bars. Use non-skid mats or decals in the tub or  shower. If you need to sit down in the shower, use a plastic, non-slip stool. Keep the floor dry. Clean up any water that spills on the floor as soon as it happens. Remove soap buildup in the tub or shower regularly. Attach bath mats securely with double-sided non-slip rug tape. Do not have throw rugs and other things on the floor that can make you trip. What can I do in the bedroom? Use night lights. Make sure that you have a light by your bed that is easy to reach. Do not use any sheets or blankets that are too big for your bed. They should not hang down onto the floor. Have a firm chair that has side arms. You can use this for support while you get dressed. Do not have throw rugs and other things on the floor that can make you trip. What can I do in the kitchen? Clean up any spills right away. Avoid walking on wet floors. Keep items that you use a lot in easy-to-reach places. If you need to reach something above you, use a strong step stool that has a grab bar. Keep electrical cords out of the  way. Do not use floor polish or wax that makes floors slippery. If you must use wax, use non-skid floor wax. Do not have throw rugs and other things on the floor that can make you trip. What can I do with my stairs? Do not leave any items on the stairs. Make sure that there are handrails on both sides of the stairs and use them. Fix handrails that are broken or loose. Make sure that handrails are as long as the stairways. Check any carpeting to make sure that it is firmly attached to the stairs. Fix any carpet that is loose or worn. Avoid having throw rugs at the top or bottom of the stairs. If you do have throw rugs, attach them to the floor with carpet tape. Make sure that you have a light switch at the top of the stairs and the bottom of the stairs. If you do not have them, ask someone to add them for you. What else can I do to help prevent falls? Wear shoes that: Do not have high heels. Have  rubber bottoms. Are comfortable and fit you well. Are closed at the toe. Do not wear sandals. If you use a stepladder: Make sure that it is fully opened. Do not climb a closed stepladder. Make sure that both sides of the stepladder are locked into place. Ask someone to hold it for you, if possible. Clearly mark and make sure that you can see: Any grab bars or handrails. First and last steps. Where the edge of each step is. Use tools that help you move around (mobility aids) if they are needed. These include: Canes. Walkers. Scooters. Crutches. Turn on the lights when you go into a dark area. Replace any light bulbs as soon as they burn out. Set up your furniture so you have a clear path. Avoid moving your furniture around. If any of your floors are uneven, fix them. If there are any pets around you, be aware of where they are. Review your medicines with your doctor. Some medicines can make you feel dizzy. This can increase your chance of falling. Ask your doctor what other things that you can do to help prevent falls. This information is not intended to replace advice given to you by your health care provider. Make sure you discuss any questions you have with your health care provider. Document Released: 02/20/2009 Document Revised: 10/02/2015 Document Reviewed: 05/31/2014 Elsevier Interactive Patient Education  2017 [employer].   Healthy Eating, Adult Healthy eating may help you get and keep a healthy body weight, reduce the risk of chronic disease, and live a long and productive life. It is important to follow a healthy eating pattern. Your nutritional and calorie needs should be met mainly by different nutrient-rich foods. What are tips for following this plan? Reading food labels Read labels and choose the following: Reduced or low sodium products. Juices with 100% fruit juice. Foods with low saturated fats (<3 g per serving) and high polyunsaturated and monounsaturated  fats. Foods with whole grains, such as whole wheat, cracked wheat, brown rice, and wild rice. Whole grains that are fortified with folic acid. This is recommended for females who are pregnant or who want to become pregnant. Read labels and do not eat or drink the following: Foods or drinks with added sugars. These include foods that contain brown sugar, corn sweetener, corn syrup, dextrose, fructose, glucose, high-fructose corn syrup, honey, invert sugar, lactose, malt syrup, maltose, molasses, raw sugar, sucrose, trehalose, or turbinado  sugar. Limit your intake of added sugars to less than 10% of your total daily calories. Do not eat more than the following amounts of added sugar per day: 6 teaspoons (25 g) for females. 9 teaspoons (38 g) for males. Foods that contain processed or refined starches and grains. Refined grain products, such as white flour, degermed cornmeal, white bread, and white rice. Shopping Choose nutrient-rich snacks, such as vegetables, whole fruits, and nuts. Avoid high-calorie and high-sugar snacks, such as potato chips, fruit snacks, and candy. Use oil-based dressings and spreads on foods instead of solid fats such as butter, margarine, sour cream, or cream cheese. Limit pre-made sauces, mixes, and "instant" products such as flavored rice, instant noodles, and ready-made pasta. Try more plant-protein sources, such as tofu, tempeh, black beans, edamame, lentils, nuts, and seeds. Explore eating plans such as the Mediterranean diet or vegetarian diet. Try heart-healthy dips made with beans and healthy fats like hummus and guacamole. Vegetables go great with these. Cooking Use oil to saut or stir-fry foods instead of solid fats such as butter, margarine, or lard. Try baking, boiling, grilling, or broiling instead of frying. Remove the fatty part of meats before cooking. Steam vegetables in water or broth. Meal planning  At meals, imagine dividing your plate into  fourths: One-half of your plate is fruits and vegetables. One-fourth of your plate is whole grains. One-fourth of your plate is protein, especially lean meats, poultry, eggs, tofu, beans, or nuts. Include low-fat dairy as part of your daily diet. Lifestyle Choose healthy options in all settings, including home, work, school, restaurants, or stores. Prepare your food safely: Wash your hands after handling raw meats. Where you prepare food, keep surfaces clean by regularly washing with hot, soapy water. Keep raw meats separate from ready-to-eat foods, such as fruits and vegetables. Cook seafood, meat, poultry, and eggs to the recommended temperature. Get a food thermometer. Store foods at safe temperatures. In general: Keep cold foods at 57F (4.4C) or below. Keep hot foods at 157F (60C) or above. Keep your freezer at [facility] (-17.8C) or below. Foods are not safe to eat if they have been between the temperatures of 40-157F (4.4-60C) for more than 2 hours. What foods should I eat? Fruits Aim to eat 1-2 cups of fresh, canned (in natural juice), or frozen fruits each day. One cup of fruit equals 1 small apple, 1 large banana, 8 large strawberries, 1 cup (237 g) canned fruit,  cup (82 g) dried fruit, or 1 cup (240 mL) 100% juice. Vegetables Aim to eat 2-4 cups of fresh and frozen vegetables each day, including different varieties and colors. One cup of vegetables equals 1 cup (91 g) broccoli or cauliflower florets, 2 medium carrots, 2 cups (150 g) raw, leafy greens, 1 large tomato, 1 large bell pepper, 1 large sweet potato, or 1 medium white potato. Grains Aim to eat 5-10 ounce-equivalents of whole grains each day. Examples of 1 ounce-equivalent of grains include 1 slice of bread, 1 cup (40 g) ready-to-eat cereal, 3 cups (24 g) popcorn, or  cup (93 g) cooked rice. Meats and other proteins Try to eat 5-7 ounce-equivalents of protein each day. Examples of 1 ounce-equivalent of protein include  1 egg,  oz nuts (12 almonds, 24 pistachios, or 7 walnut halves), 1/4 cup (90 g) cooked beans, 6 tablespoons (90 g) hummus or 1 tablespoon (16 g) peanut butter. A cut of meat or fish that is the size of a deck of cards is about 3-4 ounce-equivalents (  85 g). Of the protein you eat each week, try to have at least 8 sounce (227 g) of seafood. This is about 2 servings per week. This includes salmon, trout, herring, sardines, and anchovies. Dairy Aim to eat 3 cup-equivalents of fat-free or low-fat dairy each day. Examples of 1 cup-equivalent of dairy include 1 cup (240 mL) milk, 8 ounces (250 g) yogurt, 1 ounces (44 g) natural cheese, or 1 cup (240 mL) fortified soy milk. Fats and oils Aim for about 5 teaspoons (21 g) of fats and oils per day. Choose monounsaturated fats, such as canola and olive oils, mayonnaise made with olive oil or avocado oil, avocados, peanut butter, and most nuts, or polyunsaturated fats, such as sunflower, corn, and soybean oils, walnuts, pine nuts, sesame seeds, sunflower seeds, and flaxseed. Beverages Aim for 6 eight-ounce glasses of water per day. Limit coffee to 3-5 eight-ounce cups per day. Limit caffeinated beverages that have added calories, such as soda and energy drinks. If you drink alcohol: Limit how much you have to: 0-1 drink a day if you are male. 0-2 drinks a day if you are male. Know how much alcohol is in your drink. In the [place] one drink is one 12 oz bottle of beer (355 mL), one 5 oz glass of wine (148 mL), or one 1 oz glass of hard liquor (44 mL). Seasoning and other foods Try not to add too much salt to your food. Try using herbs and spices instead of salt. Try not to add sugar to food. This information is based on [place] nutrition guidelines. To learn more, visit [employer]. Exact amounts may vary. You may need different amounts. This information is not intended to replace advice given to you by your health care provider. Make sure you discuss any  questions you have with your health care provider. Document Revised: 01/25/2022 Document Reviewed: 01/25/2022 Elsevier Patient Education  2024 [employer].

## 2022-11-26 NOTE — Progress Notes (Deleted)
[facility] [address] [place], [place] [address] [phone]  Date:  12/01/2022   Name:  Logan Wong   DOB:  1951-01-12   MRN:  [id number]  PCP:  Pearline Cables, MD    Chief Complaint: No chief complaint on file.   History of Present Illness:  Logan Wong is a 72 y.o. very pleasant male patient who presents with the following:  Patient seen today to discuss fatigue. He is caring for his wife and who has some significant health challenges Most recent visit with myself was last August to establish care as his previous PCP was retiring  Essential hypertension Hyperlipidemia He takes Careers information officer xr for his anxiety and depression; he notes this typically works very well   He had a TURP procedure 2007 and then was dx with prostate cancer in 2019; he had a total prostatectomy for complete treatment He does see urology at [facility]- PSA has been zero   RC repair 2010- right arm  He does have osteoporosis and takes Boniva  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   Malignant neoplasm of prostate (HCC) 04/20/2018    Past Medical History:  Diagnosis Date   Allergies    Anxiety    Cancer (HCC)    Depression    GERD (gastroesophageal reflux disease)    Heart murmur    Hyperlipidemia    Hypertension    Osteoarthritis    Osteoporosis     Past Surgical History:  Procedure Laterality Date   PROSTATECTOMY  2019   ROTATOR CUFF REPAIR Right 2010   THULIUM LASER TURP (TRANSURETHRAL RESECTION OF PROSTATE)  2007    Social History   Tobacco Use   Smoking status: Former    Current packs/day: 0.00    Average packs/day: 1 pack/day for 12.0 years (12.0 ttl pk-yrs)    Types: Cigarettes    Start date: 54    Quit date: 23    Years since quitting: 38.5   Smokeless tobacco: Never  Vaping Use   Vaping status: Never Used  Substance Use Topics   Alcohol use: Yes     Alcohol/week: 4.0 standard drinks of alcohol    Types: 2 Glasses of wine, 2 Shots of liquor per week   Drug use: No    Family History  Problem Relation Age of Onset   Hypertension Mother    Osteoarthritis Mother    COPD Mother    Heart attack Mother    Heart attack Father    COPD Father    Cancer Father    Cancer Sister    Early death Sister    Early death Sister     Allergies  Allergen Reactions   Bee Pollen Cough    Medication list has been reviewed and updated.  Current Outpatient Medications on File Prior to Visit  Medication Sig Dispense Refill   albuterol (VENTOLIN HFA) 108 (90 Base) MCG/ACT inhaler Inhale 1-2 puffs into the lungs every 6 (six) hours as needed for wheezing or shortness of breath. 54 g 3   amLODipine (NORVASC) 5 MG tablet Take 1 tablet (5 mg total) by mouth daily. 90 tablet 3   Calcium-Vitamin D-Vitamin K (VIACTIV) 500-500-40 MG-UNT-MCG CHEW Chew 1 tablet by mouth every morning.     diclofenac Sodium (VOLTAREN) 1 % GEL      ibandronate (BONIVA) 150 MG tablet Take 150 mg by  mouth every 30 (thirty) days. Take in the morning with a full glass of water, on an empty stomach, and do not take anything else by mouth or lie down for the next 30 min.     meloxicam (MOBIC) 7.5 MG tablet Take 1 tablet by mouth daily . Use as needed for hand pain 90 tablet 1   Multiple Vitamin (MULTIVITAMIN WITH MINERALS) TABS tablet Take 1 tablet by mouth every morning.     pantoprazole (PROTONIX) 40 MG tablet Take 1 tablet (40 mg total) by mouth every morning. 90 tablet 3   pravastatin (PRAVACHOL) 40 MG tablet Take 1 tablet (40 mg total) by mouth every morning. 90 tablet 3   rOPINIRole (REQUIP) 1 MG tablet Take 1 tablet by mouth 1-3 hours before bedtime 90 tablet 3   tadalafil (CIALIS) 5 MG tablet Take 1 tablet (5 mg total) by mouth daily as needed for erectile dysfunction. 90 tablet 3   valsartan-hydrochlorothiazide (DIOVAN-HCT) 320-12.5 MG tablet Take 1 tablet by mouth every  morning. 90 tablet 3   venlafaxine XR (EFFEXOR-XR) 150 MG 24 hr capsule Take 1 capsule (150 mg total) by mouth daily with breakfast. 90 capsule 3   No current facility-administered medications on file prior to visit.    Review of Systems:  As per HPI- otherwise negative.   Physical Examination: There were no vitals filed for this visit. There were no vitals filed for this visit. There is no height or weight on file to calculate BMI. Ideal Body Weight:    GEN: no acute distress. HEENT: Atraumatic, Normocephalic.  Ears and Nose: No external deformity. CV: RRR, No M/G/R. No JVD. No thrill. No extra heart sounds. PULM: CTA B, no wheezes, crackles, rhonchi. No retractions. No resp. distress. No accessory muscle use. ABD: S, NT, ND, +BS. No rebound. No HSM. EXTR: No c/c/e PSYCH: Normally interactive. Conversant.    Assessment and Plan: ***  Signed Abbe Amsterdam, MD

## 2022-12-01 ENCOUNTER — Encounter: Payer: PPO | Admitting: Family Medicine

## 2022-12-01 DIAGNOSIS — Z636 Dependent relative needing care at home: Secondary | ICD-10-CM

## 2023-01-10 NOTE — Progress Notes (Deleted)
[facility] at [facility] [address] [place], [place] [address] [phone]  Date:  01/12/2023   Name:  Logan Wong   DOB:  1951-01-06   MRN:  [id number]  PCP:  Pearline Cables, MD    Chief Complaint: No chief complaint on file.   History of Present Illness:  Logan Wong is a 72 y.o. very pleasant male patient who presents with the following:  Patient seen today for physical exam Most recent visit with myself was about 1 year ago-I taking care of his wife Pattricia Boss for a long time, his PCP retired so he came to see me From our visit 1 year ago:  Essential hypertension Hyperlipidemia Osteoporosis He takes Careers information officer xr for his anxiety and depression; he notes this typically works very well For his wife [name] is having some more trouble with her memory, this is getting more difficult.  We discussed him trying to get some respite care, potentially having [name] try out the senior center He had a TURP procedure 2007 and then was dx with prostate cancer in 2019; he had a total prostatectomy for complete treatment He does see urology at [facility]- PSA has been zero  2 children living outside of [place]  Tetanus booster Recommend flu shot, COVID booster Colon cancer screening Can run hepatitis C screening with routine labs  Amlodipine Valsartan HCTZ Boniva Pantoprazole Pravastatin Venlafaxine XR 150 Requip  Bone density?  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   Malignant neoplasm of prostate (HCC) 04/20/2018    Past Medical History:  Diagnosis Date   Allergies    Anxiety    Cancer (HCC)    Depression    GERD (gastroesophageal reflux disease)    Heart murmur    Hyperlipidemia    Hypertension    Osteoarthritis    Osteoporosis     Past Surgical History:  Procedure Laterality Date   PROSTATECTOMY  2019   ROTATOR CUFF REPAIR Right 2010   THULIUM LASER TURP  (TRANSURETHRAL RESECTION OF PROSTATE)  2007    Social History   Tobacco Use   Smoking status: Former    Current packs/day: 0.00    Average packs/day: 1 pack/day for 12.0 years (12.0 ttl pk-yrs)    Types: Cigarettes    Start date: 15    Quit date: 53    Years since quitting: 38.6   Smokeless tobacco: Never  Vaping Use   Vaping status: Never Used  Substance Use Topics   Alcohol use: Yes    Alcohol/week: 4.0 standard drinks of alcohol    Types: 2 Glasses of wine, 2 Shots of liquor per week   Drug use: No    Family History  Problem Relation Age of Onset   Hypertension Mother    Osteoarthritis Mother    COPD Mother    Heart attack Mother    Heart attack Father    COPD Father    Cancer Father    Cancer Sister    Early death Sister    Early death Sister     Allergies  Allergen Reactions   Bee Pollen Cough    Medication list has been reviewed and updated.  Current Outpatient Medications on File Prior to Visit  Medication Sig Dispense Refill   albuterol (VENTOLIN HFA) 108 (90 Base) MCG/ACT inhaler Inhale 1-2 puffs into the lungs every 6 (six) hours as needed for wheezing or  shortness of breath. 54 g 3   amLODipine (NORVASC) 5 MG tablet Take 1 tablet (5 mg total) by mouth daily. 90 tablet 3   Calcium-Vitamin D-Vitamin K (VIACTIV) 500-500-40 MG-UNT-MCG CHEW Chew 1 tablet by mouth every morning.     diclofenac Sodium (VOLTAREN) 1 % GEL      ibandronate (BONIVA) 150 MG tablet Take 150 mg by mouth every 30 (thirty) days. Take in the morning with a full glass of water, on an empty stomach, and do not take anything else by mouth or lie down for the next 30 min.     meloxicam (MOBIC) 7.5 MG tablet Take 1 tablet by mouth daily . Use as needed for hand pain 90 tablet 1   Multiple Vitamin (MULTIVITAMIN WITH MINERALS) TABS tablet Take 1 tablet by mouth every morning.     pantoprazole (PROTONIX) 40 MG tablet Take 1 tablet (40 mg total) by mouth every morning. 90 tablet 3    pravastatin (PRAVACHOL) 40 MG tablet Take 1 tablet (40 mg total) by mouth every morning. 90 tablet 3   rOPINIRole (REQUIP) 1 MG tablet Take 1 tablet by mouth 1-3 hours before bedtime 90 tablet 3   tadalafil (CIALIS) 5 MG tablet Take 1 tablet (5 mg total) by mouth daily as needed for erectile dysfunction. 90 tablet 3   valsartan-hydrochlorothiazide (DIOVAN-HCT) 320-12.5 MG tablet Take 1 tablet by mouth every morning. 90 tablet 3   venlafaxine XR (EFFEXOR-XR) 150 MG 24 hr capsule Take 1 capsule (150 mg total) by mouth daily with breakfast. 90 capsule 3   No current facility-administered medications on file prior to visit.    Review of Systems:  As per HPI- otherwise negative.   Physical Examination: There were no vitals filed for this visit. There were no vitals filed for this visit. There is no height or weight on file to calculate BMI. Ideal Body Weight:    GEN: no acute distress. HEENT: Atraumatic, Normocephalic.  Ears and Nose: No external deformity. CV: RRR, No M/G/R. No JVD. No thrill. No extra heart sounds. PULM: CTA B, no wheezes, crackles, rhonchi. No retractions. No resp. distress. No accessory muscle use. ABD: S, NT, ND, +BS. No rebound. No HSM. EXTR: No c/c/e PSYCH: Normally interactive. Conversant.    Assessment and Plan: *** Physical exam today.  Encouraged healthy diet and exercise routine Will plan further follow- up pending labs.  Signed Abbe Amsterdam, MD

## 2023-01-12 ENCOUNTER — Encounter: Payer: PPO | Admitting: Family Medicine

## 2023-01-12 DIAGNOSIS — Z130 Encounter for screening for diseases of the blood and blood-forming organs and certain disorders involving the immune mechanism: Secondary | ICD-10-CM

## 2023-01-12 DIAGNOSIS — Z8546 Personal history of malignant neoplasm of prostate: Secondary | ICD-10-CM

## 2023-01-12 DIAGNOSIS — Z0000 Encounter for general adult medical examination without abnormal findings: Secondary | ICD-10-CM

## 2023-01-12 DIAGNOSIS — Z131 Encounter for screening for diabetes mellitus: Secondary | ICD-10-CM

## 2023-01-12 DIAGNOSIS — I10 Essential (primary) hypertension: Secondary | ICD-10-CM

## 2023-01-12 DIAGNOSIS — K219 Gastro-esophageal reflux disease without esophagitis: Secondary | ICD-10-CM

## 2023-01-12 DIAGNOSIS — E785 Hyperlipidemia, unspecified: Secondary | ICD-10-CM

## 2023-01-12 DIAGNOSIS — F419 Anxiety disorder, unspecified: Secondary | ICD-10-CM

## 2023-01-12 DIAGNOSIS — Z1159 Encounter for screening for other viral diseases: Secondary | ICD-10-CM

## 2023-01-18 ENCOUNTER — Encounter: Payer: Self-pay | Admitting: Internal Medicine

## 2023-01-21 NOTE — Progress Notes (Unsigned)
[facility] at [facility] [address] [place], [place] [address] [phone]  Date:  01/26/2023   Name:  [name]   DOB:  01-18-1951   MRN:  [id number]  PCP:  Pearline Cables, MD    Chief Complaint: No chief complaint on file.   History of Present Illness:  Logan Wong is a 72 y.o. very pleasant male patient who presents with the following:  Patient seen today for physical exam Most recent visit with myself was about 1 year ago-I taking care of his wife Logan Wong for a long time, his PCP retired so he came to see me From our visit 1 year ago:  Essential hypertension Hyperlipidemia Osteoporosis He takes Careers information officer xr for his anxiety and depression; he notes this typically works very well For his wife Logan Wong is having some more trouble with her memory, this is getting more difficult.  We discussed him trying to get some respite care, potentially having Logan Wong try out the senior center He had a TURP procedure 2007 and then was dx with prostate cancer in 2019; he had a total prostatectomy for complete treatment He does see urology at [facility]- PSA has been zero  2 children living outside of [place]  Tetanus booster Recommend flu shot, COVID booster Colon cancer screening Can run hepatitis C screening with routine labs  Amlodipine Valsartan HCTZ Boniva Pantoprazole Pravastatin Venlafaxine XR 150 Requip  Bone density?  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   Malignant neoplasm of prostate (HCC) 04/20/2018    Past Medical History:  Diagnosis Date   Allergies    Anxiety    Cancer (HCC)    Depression    GERD (gastroesophageal reflux disease)    Heart murmur    Hyperlipidemia    Hypertension    Osteoarthritis    Osteoporosis     Past Surgical History:  Procedure Laterality Date   PROSTATECTOMY  2019   ROTATOR CUFF REPAIR Right 2010   THULIUM LASER TURP  (TRANSURETHRAL RESECTION OF PROSTATE)  2007    Social History   Tobacco Use   Smoking status: Former    Current packs/day: 0.00    Average packs/day: 1 pack/day for 12.0 years (12.0 ttl pk-yrs)    Types: Cigarettes    Start date: 41    Quit date: 1    Years since quitting: 38.7   Smokeless tobacco: Never  Vaping Use   Vaping status: Never Used  Substance Use Topics   Alcohol use: Yes    Alcohol/week: 4.0 standard drinks of alcohol    Types: 2 Glasses of wine, 2 Shots of liquor per week   Drug use: No    Family History  Problem Relation Age of Onset   Hypertension Mother    Osteoarthritis Mother    COPD Mother    Heart attack Mother    Heart attack Father    COPD Father    Cancer Father    Cancer Sister    Early death Sister    Early death Sister     Allergies  Allergen Reactions   Bee Pollen Cough    Medication list has been reviewed and updated.  Current Outpatient Medications on File Prior to Visit  Medication Sig Dispense Refill   albuterol (VENTOLIN HFA) 108 (90 Base) MCG/ACT inhaler Inhale 1-2 puffs into the lungs every 6 (six) hours as needed for wheezing or  shortness of breath. 54 g 3   amLODipine (NORVASC) 5 MG tablet Take 1 tablet (5 mg total) by mouth daily. 90 tablet 3   Calcium-Vitamin D-Vitamin K (VIACTIV) 500-500-40 MG-UNT-MCG CHEW Chew 1 tablet by mouth every morning.     diclofenac Sodium (VOLTAREN) 1 % GEL      ibandronate (BONIVA) 150 MG tablet Take 150 mg by mouth every 30 (thirty) days. Take in the morning with a full glass of water, on an empty stomach, and do not take anything else by mouth or lie down for the next 30 min.     meloxicam (MOBIC) 7.5 MG tablet Take 1 tablet by mouth daily . Use as needed for hand pain 90 tablet 1   Multiple Vitamin (MULTIVITAMIN WITH MINERALS) TABS tablet Take 1 tablet by mouth every morning.     pantoprazole (PROTONIX) 40 MG tablet Take 1 tablet (40 mg total) by mouth every morning. 90 tablet 3    pravastatin (PRAVACHOL) 40 MG tablet Take 1 tablet (40 mg total) by mouth every morning. 90 tablet 3   rOPINIRole (REQUIP) 1 MG tablet Take 1 tablet by mouth 1-3 hours before bedtime 90 tablet 3   tadalafil (CIALIS) 5 MG tablet Take 1 tablet (5 mg total) by mouth daily as needed for erectile dysfunction. 90 tablet 3   valsartan-hydrochlorothiazide (DIOVAN-HCT) 320-12.5 MG tablet Take 1 tablet by mouth every morning. 90 tablet 3   venlafaxine XR (EFFEXOR-XR) 150 MG 24 hr capsule Take 1 capsule (150 mg total) by mouth daily with breakfast. 90 capsule 3   No current facility-administered medications on file prior to visit.    Review of Systems:  As per HPI- otherwise negative.   Physical Examination: There were no vitals filed for this visit. There were no vitals filed for this visit. There is no height or weight on file to calculate BMI. Ideal Body Weight:    GEN: no acute distress. HEENT: Atraumatic, Normocephalic.  Ears and Nose: No external deformity. CV: RRR, No M/G/R. No JVD. No thrill. No extra heart sounds. PULM: CTA B, no wheezes, crackles, rhonchi. No retractions. No resp. distress. No accessory muscle use. ABD: S, NT, ND, +BS. No rebound. No HSM. EXTR: No c/c/e PSYCH: Normally interactive. Conversant.    Assessment and Plan: *** Physical exam today.  Encouraged healthy diet and exercise routine Will plan further follow- up pending labs.  Signed Abbe Amsterdam, MD

## 2023-01-26 ENCOUNTER — Ambulatory Visit (INDEPENDENT_AMBULATORY_CARE_PROVIDER_SITE_OTHER): Payer: PPO | Admitting: Family Medicine

## 2023-01-26 VITALS — BP 134/80 | HR 96 | Temp 97.7°F | Resp 18 | Ht 68.0 in | Wt 177.2 lb

## 2023-01-26 DIAGNOSIS — E785 Hyperlipidemia, unspecified: Secondary | ICD-10-CM

## 2023-01-26 DIAGNOSIS — M19041 Primary osteoarthritis, right hand: Secondary | ICD-10-CM

## 2023-01-26 DIAGNOSIS — Z23 Encounter for immunization: Secondary | ICD-10-CM

## 2023-01-26 DIAGNOSIS — Z8546 Personal history of malignant neoplasm of prostate: Secondary | ICD-10-CM | POA: Diagnosis not present

## 2023-01-26 DIAGNOSIS — M810 Age-related osteoporosis without current pathological fracture: Secondary | ICD-10-CM | POA: Diagnosis not present

## 2023-01-26 DIAGNOSIS — F419 Anxiety disorder, unspecified: Secondary | ICD-10-CM | POA: Diagnosis not present

## 2023-01-26 DIAGNOSIS — Z131 Encounter for screening for diabetes mellitus: Secondary | ICD-10-CM

## 2023-01-26 DIAGNOSIS — R062 Wheezing: Secondary | ICD-10-CM

## 2023-01-26 DIAGNOSIS — F32A Depression, unspecified: Secondary | ICD-10-CM

## 2023-01-26 DIAGNOSIS — M19042 Primary osteoarthritis, left hand: Secondary | ICD-10-CM

## 2023-01-26 DIAGNOSIS — I10 Essential (primary) hypertension: Secondary | ICD-10-CM | POA: Diagnosis not present

## 2023-01-26 DIAGNOSIS — Z0000 Encounter for general adult medical examination without abnormal findings: Secondary | ICD-10-CM

## 2023-01-26 MED ORDER — ROPINIROLE HCL 1 MG PO TABS: ORAL_TABLET | ORAL | 3 refills | Status: DC

## 2023-01-26 MED ORDER — PRAVASTATIN SODIUM 40 MG PO TABS: 40.0000 mg | ORAL_TABLET | Freq: Every morning | ORAL | 3 refills | Status: DC

## 2023-01-26 MED ORDER — MELOXICAM 7.5 MG PO TABS: 7.5000 mg | ORAL_TABLET | Freq: Every day | ORAL | 3 refills | Status: DC

## 2023-01-26 MED ORDER — VENLAFAXINE HCL ER 150 MG PO CP24: 150.0000 mg | ORAL_CAPSULE | Freq: Every day | ORAL | 3 refills | Status: DC

## 2023-01-26 MED ORDER — ALBUTEROL SULFATE HFA 108 (90 BASE) MCG/ACT IN AERS: 1.0000 | INHALATION_SPRAY | Freq: Four times a day (QID) | RESPIRATORY_TRACT | 3 refills | Status: DC | PRN

## 2023-01-26 MED ORDER — VALSARTAN-HYDROCHLOROTHIAZIDE 320-12.5 MG PO TABS: 1.0000 | ORAL_TABLET | Freq: Every morning | ORAL | 3 refills | Status: DC

## 2023-01-26 MED ORDER — AMLODIPINE BESYLATE 5 MG PO TABS: 5.0000 mg | ORAL_TABLET | Freq: Every day | ORAL | 3 refills | Status: DC

## 2023-01-26 MED ORDER — IBANDRONATE SODIUM 150 MG PO TABS: 150.0000 mg | ORAL_TABLET | ORAL | 3 refills | Status: AC

## 2023-01-27 ENCOUNTER — Encounter: Payer: Self-pay | Admitting: Family Medicine

## 2023-01-27 LAB — COMPREHENSIVE METABOLIC PANEL
ALT: 25 U/L (ref 0–53)
AST: 26 U/L (ref 0–37)
Albumin: 4.3 g/dL (ref 3.5–5.2)
Alkaline Phosphatase: 61 U/L (ref 39–117)
BUN: 18 mg/dL (ref 6–23)
CO2: 32 mEq/L (ref 19–32)
Calcium: 9.2 mg/dL (ref 8.4–10.5)
Chloride: 101 mEq/L (ref 96–112)
Creatinine, Ser: 0.88 mg/dL (ref 0.40–1.50)
GFR: 86.69 mL/min (ref >60.00)
Glucose, Bld: 116 mg/dL — ABNORMAL HIGH (ref 70–99)
Potassium: 3.4 mEq/L — ABNORMAL LOW (ref 3.5–5.1)
Sodium: 142 mEq/L (ref 135–145)
Total Bilirubin: 0.5 mg/dL (ref 0.2–1.2)
Total Protein: 6.9 g/dL (ref 6.0–8.3)

## 2023-01-27 LAB — LIPID PANEL
Cholesterol: 184 mg/dL (ref 0–200)
HDL: 85.6 mg/dL (ref >39.00)
LDL Cholesterol: 76 mg/dL (ref 0–99)
NonHDL: 98.5
Total CHOL/HDL Ratio: 2
Triglycerides: 115 mg/dL (ref 0.0–149.0)
VLDL: 23 mg/dL (ref 0.0–40.0)

## 2023-01-27 LAB — CBC
HCT: 43.5 % (ref 39.0–52.0)
Hemoglobin: 14.6 g/dL (ref 13.0–17.0)
MCHC: 33.5 g/dL (ref 30.0–36.0)
MCV: 89.4 fl (ref 78.0–100.0)
Platelets: 245 10*3/uL (ref 150.0–400.0)
RBC: 4.86 Mil/uL (ref 4.22–5.81)
RDW: 12.9 % (ref 11.5–15.5)
WBC: 5.6 10*3/uL (ref 4.0–10.5)

## 2023-01-27 LAB — HEMOGLOBIN A1C: Hgb A1c MFr Bld: 6.1 % (ref 4.6–6.5)

## 2023-01-27 LAB — PSA: PSA: 0.1 ng/mL (ref 0.10–4.00)

## 2023-02-01 ENCOUNTER — Ambulatory Visit (AMBULATORY_SURGERY_CENTER): Payer: PPO

## 2023-02-01 VITALS — Ht 69.0 in | Wt 175.0 lb

## 2023-02-01 DIAGNOSIS — Z1211 Encounter for screening for malignant neoplasm of colon: Secondary | ICD-10-CM

## 2023-02-01 MED ORDER — PEG 3350-KCL-NA BICARB-NACL 420 G PO SOLR: 4000.0000 mL | Freq: Once | ORAL | 0 refills | Status: AC

## 2023-02-01 NOTE — Progress Notes (Signed)

## 2023-02-03 ENCOUNTER — Encounter: Payer: Self-pay | Admitting: Internal Medicine

## 2023-02-08 ENCOUNTER — Encounter: Payer: Self-pay | Admitting: Family Medicine

## 2023-02-22 ENCOUNTER — Encounter: Payer: Self-pay | Admitting: Family Medicine

## 2023-02-23 NOTE — Progress Notes (Unsigned)
[facility] at [facility] [address], [address] [place], [place] [address] [phone]  Date:  02/24/2023   Name:  Logan Wong   DOB:  04/28/1951   MRN:  [id number]  PCP:  Pearline Cables, MD    Chief Complaint: No chief complaint on file.   History of Present Illness:  Logan Wong is a 72 y.o. very pleasant male patient who presents with the following:  Virtual visit today, patient location is home in my location is office Patient again confirmed with 2 factors, he gives consent for virtual visit today Connected with pt via mychart video  The patient and myself are present on the call today Logan Wong really just wants to catch me up on his wife and  Over the last 2 weeks and has fallen twice.  She is currently in a rehab center Her condition has gotten worse (both physically and her memory), Logan Wong notes he cannot care for her any longer. He physically cannot do it.  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   Malignant neoplasm of prostate (HCC) 04/20/2018    Past Medical History:  Diagnosis Date   Allergies    Allergy    Anxiety    Cancer (HCC)    Depression    GERD (gastroesophageal reflux disease)    Heart murmur    Hyperlipidemia    Hypertension    Osteoarthritis    Osteoporosis     Past Surgical History:  Procedure Laterality Date   PROSTATECTOMY  2019   ROTATOR CUFF REPAIR Right 2010   THULIUM LASER TURP (TRANSURETHRAL RESECTION OF PROSTATE)  2007    Social History   Tobacco Use   Smoking  status: Former    Current packs/day: 0.00    Average packs/day: 1 pack/day for 12.0 years (12.0 ttl pk-yrs)    Types: Cigarettes    Start date: 80    Quit date: 35    Years since quitting: 38.8   Smokeless tobacco: Never  Vaping Use   Vaping status: Never Used  Substance Use Topics   Alcohol use: Yes    Alcohol/week: 4.0 standard drinks of alcohol    Types: 2 Glasses of wine, 2 Shots of liquor per week   Drug use: No    Family History  Problem Relation Age of Onset   Hypertension Mother    Osteoarthritis Mother    COPD Mother    Heart attack Mother    Heart attack Father    COPD Father    Cancer Father    Cancer Sister    Early death Sister    Early death Sister    Colon polyps Neg Hx    Colon cancer Neg Hx    Esophageal cancer Neg Hx    Rectal cancer Neg Hx    Stomach cancer Neg Hx     Allergies  Allergen Reactions   Bee Pollen Cough    Medication list has been reviewed and updated.  Current Outpatient Medications  on File Prior to Visit  Medication Sig Dispense Refill   albuterol (VENTOLIN HFA) 108 (90 Base) MCG/ACT inhaler Inhale 1-2 puffs into the lungs every 6 (six) hours as needed for wheezing or shortness of breath. 54 g 3   amLODipine (NORVASC) 5 MG tablet Take 1 tablet (5 mg total) by mouth daily. 90 tablet 3   Calcium-Vitamin D-Vitamin K (VIACTIV) 500-500-40 MG-UNT-MCG CHEW Chew 1 tablet by mouth every morning.     diclofenac Sodium (VOLTAREN) 1 % GEL      ibandronate (BONIVA) 150 MG tablet Take 1 tablet (150 mg total) by mouth every 30 (thirty) days. Take in the morning with a full glass of water, on an empty stomach, and do not take anything else by mouth or lie down for the next 30 min. 3 tablet 3   meloxicam (MOBIC) 7.5 MG tablet Take 1 tablet (7.5 mg total) by mouth daily. 90 tablet 3   Multiple Vitamin (MULTIVITAMIN WITH MINERALS) TABS tablet Take 1 tablet by mouth every morning.     pantoprazole (PROTONIX) 40 MG tablet Take 1 tablet (40 mg  total) by mouth every morning. 90 tablet 3   pravastatin (PRAVACHOL) 40 MG tablet Take 1 tablet (40 mg total) by mouth every morning. 90 tablet 3   rOPINIRole (REQUIP) 1 MG tablet Take 1 tablet by mouth 1-3 hours before bedtime 90 tablet 3   tadalafil (CIALIS) 5 MG tablet Take 1 tablet (5 mg total) by mouth daily as needed for erectile dysfunction. 90 tablet 3   valsartan-hydrochlorothiazide (DIOVAN-HCT) 320-12.5 MG tablet Take 1 tablet by mouth every morning. 90 tablet 3   venlafaxine XR (EFFEXOR-XR) 150 MG 24 hr capsule Take 1 capsule (150 mg total) by mouth daily with breakfast. 90 capsule 3   No current facility-administered medications on file prior to visit.    Review of Systems:  As per HPI- otherwise negative.   Physical Examination: There were no vitals filed for this visit. There were no vitals filed for this visit. There is no height or weight on file to calculate BMI. Ideal Body Weight:    Patient observed via MyChart video.  He looks well, his normal self  Assessment and Plan: Caregiver stress Spent some time discussing Logan Wong's thoughts and feelings about caring for Logan Wong Hatch today.  He and their 2 children have a plan to put her in long-term care here locally, this is of course a difficult decision but is the right one.  Offered support and encouragement  Signed Abbe Amsterdam, MD

## 2023-02-24 ENCOUNTER — Telehealth: Payer: PPO | Admitting: Family Medicine

## 2023-02-24 DIAGNOSIS — Z636 Dependent relative needing care at home: Secondary | ICD-10-CM | POA: Diagnosis not present

## 2023-02-25 ENCOUNTER — Encounter: Payer: Self-pay | Admitting: Family Medicine

## 2023-03-02 ENCOUNTER — Encounter: Payer: PPO | Admitting: Internal Medicine

## 2023-03-17 ENCOUNTER — Other Ambulatory Visit: Payer: Self-pay

## 2023-03-17 MED ORDER — PANTOPRAZOLE SODIUM 40 MG PO TBEC: 40.0000 mg | DELAYED_RELEASE_TABLET | Freq: Every morning | ORAL | 3 refills | Status: DC

## 2023-05-26 ENCOUNTER — Ambulatory Visit: Payer: PPO

## 2023-05-26 ENCOUNTER — Ambulatory Visit (INDEPENDENT_AMBULATORY_CARE_PROVIDER_SITE_OTHER): Payer: PPO | Admitting: Radiology

## 2023-05-26 ENCOUNTER — Ambulatory Visit
Admission: RE | Admit: 2023-05-26 | Discharge: 2023-05-26 | Disposition: A | Discharge status: Home / Self Care | Payer: PPO | Source: Ambulatory Visit | Attending: Family Medicine | Admitting: Family Medicine

## 2023-05-26 VITALS — BP 160/79 | HR 106 | Temp 98.1°F | Resp 20 | Ht 69.0 in | Wt 175.0 lb

## 2023-05-26 DIAGNOSIS — R0602 Shortness of breath: Secondary | ICD-10-CM | POA: Diagnosis not present

## 2023-05-26 MED ORDER — ALBUTEROL SULFATE (2.5 MG/3ML) 0.083% IN NEBU
2.5000 mg | INHALATION_SOLUTION | Freq: Once | RESPIRATORY_TRACT | Status: AC
  Administered 2023-05-26: 2.5 mg via RESPIRATORY_TRACT

## 2023-05-26 MED ORDER — IPRATROPIUM-ALBUTEROL 0.5-2.5 (3) MG/3ML IN SOLN
3.0000 mL | Freq: Once | RESPIRATORY_TRACT | Status: AC
  Administered 2023-05-26: 3 mL via RESPIRATORY_TRACT

## 2023-05-26 NOTE — ED Triage Notes (Signed)
Pt presents with complaints of shortness of breath, headaches, productive cough, nasal congestion x one week. Pt currently denies pain, just voices minimal discomfort. Robitussin, Ibuprofen, and albuterol inhaler used for symptom relief with little improvement. At-home COVID test negative on 1/11.

## 2023-05-26 NOTE — ED Provider Notes (Signed)
[name]    CSN: [id number] Arrival date & time: 05/26/23  1439      History   Chief Complaint Chief Complaint  Patient presents with   Cough    Severe cough, headache. COVID-19 test was negative on Saturday 1/11. - Entered by patient   Shortness of Breath    HPI Logan Wong is a 73 y.o. male.    Cough Associated symptoms: rhinorrhea, shortness of breath and wheezing   Associated symptoms: no chest pain, no chills, no fever, no headaches and no sore throat   Shortness of Breath Associated symptoms: cough and wheezing   Associated symptoms: no chest pain, no fever, no headaches and no sore throat   Cough for 1 week associated with clear sputum initially had nasal, chest congestion and wheezing.  At home COVID test 5 days ago was negative.  Denies chest pain, abdominal pain, nausea, vomiting, diarrhea.  Has had wheezing in the past.  Former smoker quit many years ago.  Past medical history includes hypertension, high cholesterol, GERD, osteoarthritis, prostate cancer Admits to travel to [place] 1 month ago, denies lower extremity pain or swelling history of DVT  Past Medical History:  Diagnosis Date   Allergies    Allergy    Anxiety    Cancer (HCC)    Depression    GERD (gastroesophageal reflux disease)    Heart murmur    Hyperlipidemia    Hypertension    Osteoarthritis    Osteoporosis     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   Malignant neoplasm of prostate (HCC) 04/20/2018    Past Surgical History:  Procedure Laterality Date   PROSTATECTOMY  2019   ROTATOR CUFF REPAIR Right 2010   THULIUM LASER TURP (TRANSURETHRAL RESECTION OF PROSTATE)  2007       Home Medications    Prior to Admission medications   Medication Sig Start Date End Date Taking? Authorizing Provider  albuterol (VENTOLIN HFA) 108 (90 Base) MCG/ACT inhaler Inhale 1-2 puffs into the lungs every 6 (six) hours as needed  for wheezing or shortness of breath. 01/26/23   Copland, Gwenlyn Found, MD  amLODipine (NORVASC) 5 MG tablet Take 1 tablet (5 mg total) by mouth daily. 01/26/23   Copland, Gwenlyn Found, MD  Calcium-Vitamin D-Vitamin K (VIACTIV) 500-500-40 MG-UNT-MCG CHEW Chew 1 tablet by mouth every morning.    [provider]  diclofenac Sodium (VOLTAREN) 1 % GEL     [provider]  ibandronate (BONIVA) 150 MG tablet Take 1 tablet (150 mg total) by mouth every 30 (thirty) days. Take in the morning with a full glass of water, on an empty stomach, and do not take anything else by mouth or lie down for the next 30 min. 01/26/23   Copland, Gwenlyn Found, MD  meloxicam (MOBIC) 7.5 MG tablet Take 1 tablet (7.5 mg total) by mouth daily. 01/26/23   Copland, Gwenlyn Found, MD  Multiple Vitamin (MULTIVITAMIN WITH MINERALS) TABS tablet Take 1 tablet by mouth every morning.    [provider]  pantoprazole (PROTONIX) 40 MG tablet Take 1 tablet (40 mg total) by mouth every morning. 03/17/23   Copland, Gwenlyn Found, MD  pravastatin (PRAVACHOL) 40 MG tablet Take 1 tablet (40 mg total) by mouth every morning. 01/26/23   Copland, Gwenlyn Found, MD  rOPINIRole (REQUIP) 1 MG tablet Take 1 tablet by mouth 1-3 hours before bedtime 01/26/23   Copland, Gwenlyn Found, MD  tadalafil (CIALIS) 5 MG tablet Take 1 tablet (5 mg total) by mouth daily as needed for erectile dysfunction. 08/23/22   Copland, Gwenlyn Found, MD  valsartan-hydrochlorothiazide (DIOVAN-HCT) 320-12.5 MG tablet Take 1 tablet by mouth every morning. 01/26/23   Copland, Gwenlyn Found, MD  venlafaxine XR (EFFEXOR-XR) 150 MG 24 hr capsule Take 1 capsule (150 mg total) by mouth daily with breakfast. 01/26/23   Copland, Gwenlyn Found, MD    Family History Family History  Problem Relation Age of Onset   Hypertension Mother    Osteoarthritis Mother    COPD Mother    Heart attack Mother    Heart attack Father    COPD Father    Cancer Father    Cancer Sister    Early death Sister    Early  death Sister    Colon polyps Neg Hx    Colon cancer Neg Hx    Esophageal cancer Neg Hx    Rectal cancer Neg Hx    Stomach cancer Neg Hx     Social History Social History   Tobacco Use   Smoking status: Former    Current packs/day: 0.00    Average packs/day: 1 pack/day for 12.0 years (12.0 ttl pk-yrs)    Types: Cigarettes    Start date: 14    Quit date: 1986    Years since quitting: 39.0   Smokeless tobacco: Never  Vaping Use   Vaping status: Never Used  Substance Use Topics   Alcohol use: Yes    Alcohol/week: 4.0 standard drinks of alcohol    Types: 2 Glasses of wine, 2 Shots of liquor per week   Drug use: No     Allergies   Bee pollen   Review of Systems Review of Systems  Constitutional:  Positive for fatigue. Negative for chills and fever.  HENT:  Positive for congestion and rhinorrhea. Negative for sore throat.   Respiratory:  Positive for cough, shortness of breath and wheezing.   Cardiovascular:  Negative for chest pain and palpitations.  Neurological:  Negative for dizziness and headaches.     Physical Exam Triage Vital Signs ED Triage Vitals  Encounter Vitals Group     BP 05/26/23 1510 (!) 160/79     Systolic BP Percentile --      Diastolic BP Percentile --      Pulse Rate 05/26/23 1510 (!) 110     Resp 05/26/23 1510 (!) 24     Temp 05/26/23 1510 98.1 F (36.7 C)     Temp Source 05/26/23 1510 Oral     SpO2 05/26/23 1510 (!) 88 %     Weight 05/26/23 1518 175 lb (79.4 kg)     Height 05/26/23 1518 5\' 9"  (1.753 m)     Head Circumference --      Peak Flow --      Pain Score 05/26/23 1518 0     Pain Loc --      Pain Education --      Exclude from Growth Chart --    No data found.  Updated Vital Signs BP (!) 160/79 (BP Location: Right Arm)   Pulse (!) 106   Temp 98.1 F (36.7 C) (Oral)   Resp 20   Ht 5\' 9"  (1.753 m)   Wt 175 lb (79.4 kg)   SpO2 91%   BMI 25.84 kg/m   Visual Acuity Right Eye Distance:   Left Eye Distance:    Bilateral Distance:    Right Eye Near:  Left Eye Near:    Bilateral Near:     Physical Exam Vitals and nursing note reviewed.  Constitutional:      Appearance: He is not ill-appearing.  HENT:     Head: Normocephalic and atraumatic.  Cardiovascular:     Rate and Rhythm: Regular rhythm. Tachycardia present.  Pulmonary:     Breath sounds: Decreased breath sounds and wheezing present.  Musculoskeletal:     Cervical back: Neck supple.  Neurological:     Mental Status: He is alert.      UC Treatments / Results  Labs (all labs ordered are listed, but only abnormal results are displayed) Labs Reviewed - No data to display  EKG   Radiology No results found.  Procedures Procedures (including critical care time)  Medications Ordered in UC Medications  ipratropium-albuterol (DUONEB) 0.5-2.5 (3) MG/3ML nebulizer solution 3 mL (has no administration in time range)    Initial Impression / Assessment and Plan / UC Course  I have reviewed the triage vital signs and the nursing notes.  Pertinent labs & imaging results that were available during my care of the patient were reviewed by me and considered in my medical decision making (see chart for details).     73 year old male with 1 week of cough now having shortness of breath for several days, has diffuse wheezing and decreased breath sounds on exam, he was given a nebulizer treatment and a chest x-ray was obtained. Examined after first nebulizer treatment states he feels better however his oxygen saturation remains 88 to 89%.  Continues with decreased breath sounds and wheezing.  Second nebulizer treatment ordered. Patient examined after second neb treatment, oxygen saturation 91 to 93% on room air he continues to have wheezing, and he is tachycardic at 105.  Will diagnosis includes pneumonia, reactive airway disease, pulmonary embolism.  Recommend ED evaluation patient will drive self to ED. chest x-ray independently viewed by  me questionable cardiomegaly otherwise NAD Final Clinical Impressions(s) / UC Diagnoses   Final diagnoses:  SOB (shortness of breath)   Discharge Instructions   None    ED Prescriptions   None    PDMP not reviewed this encounter.   Meliton Rattan, [place] 05/26/23 1601

## 2023-05-26 NOTE — Discharge Instructions (Signed)
Go to the emergency department for further evaluati  The x-ray reading we discussed is preliminary. Your x-ray will be read by a radiologist in next few hours. If there is a discrepancy, you will be contacted, and instructed on a new plan for you care.  on and treatment

## 2023-05-26 NOTE — ED Notes (Signed)
Patient is being discharged from the Urgent Care and sent to the Emergency Department via private vehicle . Per Marylene Land PA, patient is in need of higher level of care due to SOB & low O2 readings. Patient is aware and verbalizes understanding of plan of care.  Vitals:   05/26/23 1519 05/26/23 1540  BP:    Pulse: (!) 106   Resp: 20   Temp:    SpO2: 91% (!) 89%

## 2023-07-26 NOTE — Progress Notes (Deleted)
 [facility] [address], [address] [place], [place] [address] [phone]  Date:  07/28/2023   Name:  Logan Wong   DOB:  03/08/1951   MRN:  [id number]  PCP:  Pearline Cables, MD    Chief Complaint: No chief complaint on file.   History of Present Illness:  Logan Wong is a 73 y.o. very pleasant male patient who presents with the following:  Patient seen today with concern about sore throat Most recent visit with myself was in [place] that time we discussed concerns regarding his wife of many years,Annie.  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    Past Medical History:  Diagnosis Date   Allergies    Allergy    Anxiety    Cancer (HCC)    Depression    GERD (gastroesophageal reflux disease)    Heart murmur    Hyperlipidemia    Hypertension    Osteoarthritis    Osteoporosis     Past Surgical History:  Procedure Laterality Date   PROSTATECTOMY  2019   ROTATOR CUFF REPAIR Right 2010   THULIUM LASER TURP (TRANSURETHRAL RESECTION OF PROSTATE)  2007    Social History   Tobacco Use   Smoking status: Former    Current packs/day: 0.00    Average packs/day: 1 pack/day for 12.0 years (12.0 ttl pk-yrs)    Types: Cigarettes    Start date: 46    Quit date: 32    Years since quitting: 39.2   Smokeless tobacco: Never  Vaping Use   Vaping status: Never Used  Substance Use Topics   Alcohol use: Yes    Alcohol/week: 4.0 standard drinks of alcohol    Types: 2 Glasses of wine, 2 Shots of liquor per week   Drug use: No    Family History   Problem Relation Age of Onset   Hypertension Mother    Osteoarthritis Mother    COPD Mother    Heart attack Mother    Heart attack Father    COPD Father    Cancer Father    Cancer Sister    Early death Sister    Early death Sister    Colon polyps Neg Hx    Colon cancer Neg Hx    Esophageal cancer Neg Hx    Rectal cancer Neg Hx    Stomach cancer Neg Hx     Allergies  Allergen Reactions   Bee Pollen Cough    Medication list has been reviewed and updated.  Current Outpatient Medications on File Prior to Visit  Medication Sig Dispense Refill   albuterol (VENTOLIN HFA) 108 (90 Base) MCG/ACT inhaler Inhale 1-2 puffs into the lungs every 6 (six) hours as needed for wheezing or shortness of breath. 54 g 3   amLODipine (NORVASC) 5 MG tablet Take 1 tablet (5 mg total) by mouth daily. 90 tablet 3   Calcium-Vitamin D-Vitamin K (VIACTIV) 500-500-40 MG-UNT-MCG CHEW Chew 1 tablet by mouth every morning.     diclofenac Sodium (VOLTAREN) 1 % GEL  ibandronate (BONIVA) 150 MG tablet Take 1 tablet (150 mg total) by mouth every 30 (thirty) days. Take in the morning with a full glass of water, on an empty stomach, and do not take anything else by mouth or lie down for the next 30 min. 3 tablet 3   meloxicam (MOBIC) 7.5 MG tablet Take 1 tablet (7.5 mg total) by mouth daily. 90 tablet 3   Multiple Vitamin (MULTIVITAMIN WITH MINERALS) TABS tablet Take 1 tablet by mouth every morning.     pantoprazole (PROTONIX) 40 MG tablet Take 1 tablet (40 mg total) by mouth every morning. 90 tablet 3   pravastatin (PRAVACHOL) 40 MG tablet Take 1 tablet (40 mg total) by mouth every morning. 90 tablet 3   rOPINIRole (REQUIP) 1 MG tablet Take 1 tablet by mouth 1-3 hours before bedtime 90 tablet 3   tadalafil (CIALIS) 5 MG tablet Take 1 tablet (5 mg total) by mouth daily as needed for erectile dysfunction. 90 tablet 3   valsartan-hydrochlorothiazide (DIOVAN-HCT) 320-12.5 MG tablet Take 1 tablet by mouth every  morning. 90 tablet 3   venlafaxine XR (EFFEXOR-XR) 150 MG 24 hr capsule Take 1 capsule (150 mg total) by mouth daily with breakfast. 90 capsule 3   No current facility-administered medications on file prior to visit.    Review of Systems:  As per HPI- otherwise negative.   Physical Examination: There were no vitals filed for this visit. There were no vitals filed for this visit. There is no height or weight on file to calculate BMI. Ideal Body Weight:    GEN: no acute distress. HEENT: Atraumatic, Normocephalic.  Ears and Nose: No external deformity. CV: RRR, No M/G/R. No JVD. No thrill. No extra heart sounds. PULM: CTA B, no wheezes, crackles, rhonchi. No retractions. No resp. distress. No accessory muscle use. ABD: S, NT, ND, +BS. No rebound. No HSM. EXTR: No c/c/e PSYCH: Normally interactive. Conversant.    Assessment and Plan: ***  Signed Abbe Amsterdam, MD

## 2023-07-27 ENCOUNTER — Ambulatory Visit: Admitting: Family Medicine

## 2023-07-28 ENCOUNTER — Ambulatory Visit: Admitting: Family Medicine

## 2023-09-19 ENCOUNTER — Encounter: Payer: Self-pay | Admitting: Family Medicine

## 2023-09-19 MED ORDER — TADALAFIL 5 MG PO TABS: 5.0000 mg | ORAL_TABLET | Freq: Every day | ORAL | 0 refills | Status: DC | PRN

## 2023-10-10 ENCOUNTER — Encounter: Payer: Self-pay | Admitting: Family Medicine

## 2023-11-19 DIAGNOSIS — U071 COVID-19: Secondary | ICD-10-CM | POA: Diagnosis not present

## 2023-11-19 DIAGNOSIS — R0981 Nasal congestion: Secondary | ICD-10-CM | POA: Diagnosis not present

## 2023-11-19 DIAGNOSIS — R051 Acute cough: Secondary | ICD-10-CM | POA: Diagnosis not present

## 2023-11-19 DIAGNOSIS — R5383 Other fatigue: Secondary | ICD-10-CM | POA: Diagnosis not present

## 2023-11-29 ENCOUNTER — Ambulatory Visit (INDEPENDENT_AMBULATORY_CARE_PROVIDER_SITE_OTHER): Payer: PPO | Admitting: *Deleted

## 2023-11-29 VITALS — BP 123/88 | HR 72 | Ht 68.0 in | Wt 162.0 lb

## 2023-11-29 DIAGNOSIS — Z0000 Encounter for general adult medical examination without abnormal findings: Secondary | ICD-10-CM

## 2023-11-29 NOTE — Patient Instructions (Addendum)
 Logan Wong , Thank you for taking time out of your busy schedule to complete your Annual Wellness Visit with me. I enjoyed our conversation and look forward to speaking with you again next year. I, as well as your care team,  appreciate your ongoing commitment to your health goals. Please review the following plan we discussed and let me know if I can assist you in the future. Your Game plan/ To Do List    Referrals: If you haven't heard from the office you've been referred to, please reach out to them at the phone provided.   [facility]: [phone]  Follow up Visits: Next Medicare AWV with our clinical staff: 11/29/24 1:40pm    Next Office Visit with your provider: 02/20/24 8:40am  Clinician Recommendations:  Aim for 30 minutes of exercise or brisk walking, 6-8 glasses of water, and 5 servings of fruits and vegetables each day.   You will need to get the following vaccines at your local pharmacy: tetanus      This is a list of the screening recommended for you and due dates:  Health Maintenance  Topic Date Due   Hepatitis C Screening  Never done   DTaP/Tdap/Td vaccine (2 - Td or Tdap) 03/30/2022   Colon Cancer Screening  10/26/2022   COVID-19 Vaccine (4 - 2024-25 season) 01/09/2023   Medicare Annual Wellness Visit  11/23/2023   Flu Shot  12/09/2023   Pneumococcal Vaccine for age over 67  Completed   Zoster (Shingles) Vaccine  Completed   Hepatitis B Vaccine  Aged Out   HPV Vaccine  Aged Out   Meningitis B Vaccine  Aged Out    Advanced directives: (Copy Requested) Please bring a copy of your health care power of attorney and living will to the office to be added to your chart at your convenience. You can mail to [facility] [address] [place], [place] [address] or email to [email or web address] Advance Care Planning is important because it:  [x]  Makes sure you receive the medical care that is consistent with your values, goals, and preferences  [x]   It provides guidance to your family and loved ones and reduces their decisional burden about whether or not they are making the right decisions based on your wishes.  Follow the link provided in your after visit summary or read over the paperwork we have mailed to you to help you started getting your Advance Directives in place. If you need assistance in completing these, please reach out to us  so that we can help you!  See attachments for Preventive Care and Fall Prevention Tips.

## 2023-11-29 NOTE — Progress Notes (Signed)
 Please attest this visit in the absence of patient primary care provider.    Subjective:   Logan Wong is a 73 y.o. who presents for a Medicare Wellness preventive visit.  As a reminder, Annual Wellness Visits don't include a physical exam, and some assessments may be limited, especially if this visit is performed virtually. We may recommend an in-person follow-up visit with your provider if needed.  Visit Complete: Virtual I connected with  Logan Wong on 11/29/23 by a audio enabled telemedicine application and verified that I am speaking with the correct person using two identifiers.  Patient Location: Home  Provider Location: Office/Clinic  I discussed the limitations of evaluation and management by telemedicine. The patient expressed understanding and agreed to proceed.  Vital Signs: Because this visit was a virtual/telehealth visit, some criteria may be missing or patient reported. Any vitals not documented were not able to be obtained and vitals that have been documented are patient reported.  VideoDeclined- This patient declined Librarian, academic. Therefore the visit was completed with audio only.  Persons Participating in Visit: Patient.  AWV Questionnaire: No: Patient Medicare AWV questionnaire was not completed prior to this visit.  Cardiac Risk Factors include: advanced age (>60men, >29 women);male gender;hypertension;dyslipidemia;Other (see comment), Risk factor comments: history of prostate cancer     Objective:    Today's Vitals   11/29/23 1341  BP: 123/88  Pulse: 72  SpO2: 96%  Weight: 162 lb (73.5 kg)  Height: 5' 8 (1.727 m)   Body mass index is 24.63 kg/m.     11/29/2023    2:03 PM 11/23/2022    2:19 PM 04/10/2022   11:19 AM 01/04/2022    3:23 PM  Advanced Directives  Does Patient Have a Medical Advance Directive? Yes Yes No Yes  Type of Estate agent of [place];Living will Healthcare Power  of [place];Living will  Out of facility DNR (pink MOST or yellow form);Living will;Healthcare Power of Attorney  Does patient want to make changes to medical advance directive? No - Patient declined No - Patient declined  No - Patient declined  Copy of Healthcare Power of Attorney in Chart? No - copy requested No - copy requested  No - copy requested    Current Medications (verified) Outpatient Encounter Medications as of 11/29/2023  Medication Sig   albuterol  (VENTOLIN  HFA) 108 (90 Base) MCG/ACT inhaler Inhale 1-2 puffs into the lungs every 6 (six) hours as needed for wheezing or shortness of breath.   amLODipine  (NORVASC ) 5 MG tablet Take 1 tablet (5 mg total) by mouth daily.   Calcium-Vitamin D-Vitamin K (VIACTIV) 500-500-40 MG-UNT-MCG CHEW Chew 1 tablet by mouth every morning.   diclofenac Sodium (VOLTAREN) 1 % GEL    ibandronate  (BONIVA ) 150 MG tablet Take 1 tablet (150 mg total) by mouth every 30 (thirty) days. Take in the morning with a full glass of water, on an empty stomach, and do not take anything else by mouth or lie down for the next 30 min.   meloxicam  (MOBIC ) 7.5 MG tablet Take 1 tablet (7.5 mg total) by mouth daily.   Multiple Vitamin (MULTIVITAMIN WITH MINERALS) TABS tablet Take 1 tablet by mouth every morning.   pantoprazole  (PROTONIX ) 40 MG tablet Take 1 tablet (40 mg total) by mouth every morning.   pravastatin  (PRAVACHOL ) 40 MG tablet Take 1 tablet (40 mg total) by mouth every morning.   rOPINIRole  (REQUIP ) 1 MG tablet Take 1 tablet by mouth 1-3 hours  before bedtime   tadalafil  (CIALIS ) 5 MG tablet Take 1 tablet (5 mg total) by mouth daily as needed for erectile dysfunction.   valsartan -hydrochlorothiazide  (DIOVAN -HCT) 320-12.5 MG tablet Take 1 tablet by mouth every morning.   venlafaxine  XR (EFFEXOR -XR) 150 MG 24 hr capsule Take 1 capsule (150 mg total) by mouth daily with breakfast.   No facility-administered encounter medications on file as of 11/29/2023.    Allergies  (verified) Patient has no active allergies.   History: Past Medical History:  Diagnosis Date   Allergies    Allergy    Anxiety    Cancer (HCC)    Depression    GERD (gastroesophageal reflux disease)    Heart murmur    Hyperlipidemia    Hypertension    Osteoarthritis    Osteoporosis    Past Surgical History:  Procedure Laterality Date   PROSTATECTOMY  2019   ROTATOR CUFF REPAIR Right 2010   THULIUM LASER TURP (TRANSURETHRAL RESECTION OF PROSTATE)  2007   Family History  Problem Relation Age of Onset   Hypertension Mother    Osteoarthritis Mother    COPD Mother    Heart attack Mother    Heart attack Father    COPD Father    Cancer Father    Cancer Sister    Early death Sister    Early death Sister    Colon polyps Neg Hx    Colon cancer Neg Hx    Esophageal cancer Neg Hx    Rectal cancer Neg Hx    Stomach cancer Neg Hx    Social History   Socioeconomic History   Marital status: Married    Spouse name: Jenkins   Number of children: Not on file   Years of education: Not on file   Highest education level: Bachelor's degree (e.g., BA, AB, BS)  Occupational History   Occupation: retired  Tobacco Use   Smoking status: Former    Current packs/day: 0.00    Average packs/day: 1 pack/day for 12.0 years (12.0 ttl pk-yrs)    Types: Cigarettes    Start date: 60    Quit date: 1986    Years since quitting: 39.5   Smokeless tobacco: Never  Vaping Use   Vaping status: Never Used  Substance and Sexual Activity   Alcohol use: Yes    Alcohol/week: 4.0 standard drinks of alcohol    Types: 2 Glasses of wine, 2 Shots of liquor per week   Drug use: No   Sexual activity: Not on file  Other Topics Concern   Not on file  Social History Narrative   Patient is married with 2 step-sons   Social Drivers of Health   Financial Resource Strain: High Risk (11/29/2023)   Overall Financial Resource Strain (CARDIA)    Difficulty of Paying Living Expenses: Very hard  Food  Insecurity: No Food Insecurity (11/29/2023)   Hunger Vital Sign    Worried About Running Out of Food in the Last Year: Never true    Ran Out of Food in the Last Year: Never true  Transportation Needs: No Transportation Needs (11/29/2023)   PRAPARE - Administrator, Civil Service (Medical): No    Lack of Transportation (Non-Medical): No  Physical Activity: Insufficiently Active (11/29/2023)   Exercise Vital Sign    Days of Exercise per Week: 2 days    Minutes of Exercise per Session: 20 min  Stress: No Stress Concern Present (11/29/2023)   [employer] of Occupational Health - Occupational  Stress Questionnaire    Feeling of Stress: Only a little  Social Connections: Socially Integrated (11/29/2023)   Social Connection and Isolation Panel    Frequency of Communication with Friends and Family: More than three times a week    Frequency of Social Gatherings with Friends and Family: Once a week    Attends Religious Services: More than 4 times per year    Active Member of [employer] or Organizations: Yes    Attends Banker Meetings: 1 to 4 times per year    Marital Status: Married    Tobacco Counseling Counseling given: Not Answered    Clinical Intake:  Pre-visit preparation completed: Yes  Pain : No/denies pain     BMI - recorded: 24.63 Nutritional Status: BMI of 19-24  Normal Nutritional Risks: None Diabetes: No  Lab Results  Component Value Date   HGBA1C 6.1 01/26/2023   HGBA1C 6.3 01/04/2022           Activities of Daily Living     11/29/2023    1:43 PM  In your present state of health, do you have any difficulty performing the following activities:  Hearing? 0  Vision? 0  Difficulty concentrating or making decisions? 0  Walking or climbing stairs? 0  Dressing or bathing? 0  Doing errands, shopping? 0  Preparing Food and eating ? N  Using the Toilet? N  In the past six months, have you accidently leaked urine? N  Do you have problems  with loss of bowel control? N  Managing your Medications? N  Managing your Finances? N  Housekeeping or managing your Housekeeping? N    Patient Care Team: Copland, Harlene BROCKS, MD as PCP - General (Family Medicine) [facility]  I have updated your Care Teams any recent Medical Services you may have received from other providers in the past year.     Assessment:   This is a routine wellness examination for Whitaker.  Hearing/Vision screen Hearing Screening - Comments:: Denies hearing difficulties.  Vision Screening - Comments:: Last eye exam with [facility]) in 08/2023    Goals Addressed               This Visit's Progress     Patient Stated (pt-stated)        He wants to increase physical activity to 4-5 days a week 30 minutes each day.       Depression Screen     11/29/2023    2:00 PM 11/23/2022    2:14 PM 01/04/2022    3:34 PM  PHQ 2/9 Scores  PHQ - 2 Score 0 0 0  PHQ- 9 Score 3 0     Fall Risk     11/29/2023    1:51 PM 11/22/2022    2:27 PM 01/04/2022    3:34 PM  Fall Risk   Falls in the past year? 0 0 0  Number falls in past yr: 0 0 0  Injury with Fall? 0 0 0  Risk for fall due to : No Fall Risks No Fall Risks   Follow up Education provided Falls prevention discussed     MEDICARE RISK AT HOME:  Medicare Risk at Home Any stairs in or around the home?: No If so, are there any without handrails?: No Home free of loose throw rugs in walkways, pet beds, electrical cords, etc?: Yes Adequate lighting in your home to reduce risk of falls?: Yes Life alert?: No Use of a cane, walker  or w/c?: No Grab bars in the bathroom?: Yes Shower chair or bench in shower?: Yes Elevated toilet seat or a handicapped toilet?: Yes (comfort height)  TIMED UP AND GO:  Was the test performed?  No,audio  Cognitive Function: 6CIT completed        11/23/2022    2:22 PM  6CIT Screen  What Year? 0 points  What month? 0 points  What time? 0  points  Count back from 20 0 points  Months in reverse 0 points  Repeat phrase 0 points  Total Score 0 points    Immunizations Immunization History  Administered Date(s) Administered   Fluad Trivalent(High Dose 65+) 01/26/2023   Influenza-Unspecified 01/14/2022   PFIZER(Purple Top)SARS-COV-2 Vaccination 06/01/2019, 06/22/2019, 03/24/2020   Pneumococcal Conjugate-13 11/14/2017   Pneumococcal Polysaccharide-23 08/17/2021   Tdap 03/30/2012   Zoster Recombinant(Shingrix) 07/31/2020, 01/23/2021    Screening Tests Health Maintenance  Topic Date Due   Hepatitis C Screening  Never done   DTaP/Tdap/Td (2 - Td or Tdap) 03/30/2022   Colonoscopy  10/26/2022   Medicare Annual Wellness (AWV)  11/23/2023   COVID-19 Vaccine (4 - 2024-25 season) 11/28/2024 (Originally 01/09/2023)   INFLUENZA VACCINE  12/09/2023   Pneumococcal Vaccine: 50+ Years  Completed   Zoster Vaccines- Shingrix  Completed   Hepatitis B Vaccines  Aged Out   HPV VACCINES  Aged Out   Meningococcal B Vaccine  Aged Out    Health Maintenance  Health Maintenance Due  Topic Date Due   Hepatitis C Screening  Never done   DTaP/Tdap/Td (2 - Td or Tdap) 03/30/2022   Colonoscopy  10/26/2022   Medicare Annual Wellness (AWV)  11/23/2023   Health Maintenance Items Addressed: Will get tetanus booster at pharmacy. Will complete Hep C screening at next OV. Number provided for pt to call GI to schedule consult for screening colonoscopy (referral made previously).   Additional Screening:  Vision Screening: Recommended annual ophthalmology exams for early detection of glaucoma and other disorders of the eye. Would you like a referral to an eye doctor? No    Dental Screening: Recommended annual dental exams for proper oral hygiene  Community Resource Referral / Chronic Care Management: CRR required this visit?  No   CCM required this visit?  No   Plan:    I have personally reviewed and noted the following in the patient's  chart:   Medical and social history Use of alcohol, tobacco or illicit drugs  Current medications and supplements including opioid prescriptions. Patient is not currently taking opioid prescriptions. Functional ability and status Nutritional status Physical activity Advanced directives List of other physicians Hospitalizations, surgeries, and ER visits in previous 12 months Vitals Screenings to include cognitive, depression, and falls Referrals and appointments  In addition, I have reviewed and discussed with patient certain preventive protocols, quality metrics, and best practice recommendations. A written personalized care plan for preventive services as well as general preventive health recommendations were provided to patient.   Lolita Libra, CMA   11/29/2023   After Visit Summary: (MyChart) Due to this being a telephonic visit, the after visit summary with patients personalized plan was offered to patient via MyChart   Notes: Nothing significant to report at this time.

## 2023-12-13 ENCOUNTER — Other Ambulatory Visit: Payer: Self-pay | Admitting: Family Medicine

## 2024-01-19 ENCOUNTER — Telehealth: Payer: Self-pay | Admitting: Family Medicine

## 2024-01-19 DIAGNOSIS — F419 Anxiety disorder, unspecified: Secondary | ICD-10-CM

## 2024-01-19 MED ORDER — ROPINIROLE HCL 1 MG PO TABS: ORAL_TABLET | ORAL | 0 refills | Status: DC

## 2024-01-19 NOTE — Telephone Encounter (Signed)
 Copied from CRM [id number]. Topic: Clinical - Medication Refill >> Jan 19, 2024  9:48 AM Rea C wrote: Medication: rOPINIRole  (REQUIP ) 1 MG tablet  Has the patient contacted their pharmacy? Pharmacy called for refill.   This is the patient's preferred pharmacy:   [facility]) [place]  - [place], [place] - [address] [facility] [address] [place] [address] Phone: [phone] Fax: [phone]  Is this the correct pharmacy for this prescription? Yes  If no, delete pharmacy and type the correct one.   Has the prescription been filled recently? Yes   Is the patient out of the medication? Yes  Has the patient been seen for an appointment in the last year OR does the patient have an upcoming appointment? Yes  Can we respond through MyChart? Yes   Agent: Please be advised that Rx refills may take up to 3 business days. We ask that you follow-up with your pharmacy.

## 2024-01-26 ENCOUNTER — Telehealth: Admitting: Family

## 2024-01-26 DIAGNOSIS — I959 Hypotension, unspecified: Secondary | ICD-10-CM | POA: Diagnosis not present

## 2024-01-26 NOTE — Patient Instructions (Signed)
 Hypotension As the heart beats, it forces blood through the body. Hypotension, commonly called low blood pressure, is when the force of blood pumping through the arteries is too weak. Arteries are blood vessels that carry blood from the heart throughout the body. Depending on the cause and severity, hypotension may be harmless (benign) or may cause serious problems (be critical). When your blood pressure is too low, you may not get enough blood to your brain or to the rest of your organs. This can cause weakness, light-headedness, a rapid heartbeat, and fainting. What are the causes? This condition may be caused by: Blood loss. Loss of body fluids (dehydration). Heart problems. Hormone (endocrine) problems. Pregnancy. Severe infection. Lack of certain nutrients. Severe allergic reactions (anaphylaxis). Certain medicines, such as blood pressure medicine or medicines that make the body lose excess fluids (diuretics). Sometimes, hypotension may be caused by not taking medicine as directed, such as taking too much of a certain medicine. What increases the risk? The following factors may make you more likely to develop this condition: Age. Risk increases as you get older. Having a condition that affects the heart or the central nervous system. What are the signs or symptoms? Common symptoms of this condition include: Weakness. Light-headedness. Dizziness. Blurred vision. Tiredness (fatigue). Rapid heartbeat. Fainting, in severe cases. How is this diagnosed? This condition is diagnosed based on: Your medical history. Your symptoms. Your blood pressure measurement. Your health care provider will check your blood pressure when you are: Lying down. Sitting. Standing. A blood pressure reading is recorded as two numbers, such as "120 over 80" (or 120/80). The first ("top") number is called the systolic pressure. It is a measure of the pressure in your arteries as your heart beats. The second  ("bottom") number is called the diastolic pressure. It is a measure of the pressure in your arteries when your heart relaxes between beats. Blood pressure is measured in a unit called mm Hg. Healthy blood pressure for most adults is 120/80. If your blood pressure is below 90/60, you may be diagnosed with hypotension. Other information or tests that may be used to diagnose hypotension include: Your other vital signs, such as your heart rate and temperature. Blood tests. Tilt table test. For this test, you will be safely secured to a table that moves you from a lying position to an upright position. Your heart rhythm and blood pressure will be monitored during the test. How is this treated? Treatment for this condition may include: Changing your diet. This may involve drinking more water or increasing your salt (sodium) intake with high-sodium foods. Taking medicines to raise your blood pressure. Changing the dosage of certain medicines you are taking that might be lowering your blood pressure. Wearing compression stockings. These stockings help to prevent blood clots and reduce swelling in your legs. In some cases, you may need to go to the hospital for: Fluid replacement. This means you will receive fluids through an IV. Blood replacement. This means you will receive donated blood through an IV (transfusion). Treating an infection or heart problems, if this applies. Monitoring. You may need to be monitored while medicines that you are taking wear off. Follow these instructions at home: Eating and drinking  Drink enough fluid to keep your urine pale yellow. Eat a healthy diet, and follow instructions from your health care provider about eating or drinking restrictions. A healthy diet includes: Fresh fruits and vegetables. Whole grains. Lean meats. Low-fat dairy products. Increase your salt intake if told  to do so. Do not add extra salt to your diet unless your health care provider tells you  to do that. Eat frequent, small meals. Avoid standing up suddenly after eating. Medicines Take over-the-counter and prescription medicines only as told by your health care provider. Follow instructions from your health care provider about changing the dosage of your current medicines, if this applies. Do not stop or adjust any of your medicines on your own. General instructions  Wear compression stockings as told by your health care provider. Get up slowly from lying down or sitting positions. This gives your blood pressure a chance to adjust. Avoid hot showers and excessive heat as directed by your health care provider. Return to your normal activities as told by your health care provider. Ask your health care provider what activities are safe for you. Do not use any products that contain nicotine or tobacco. These products include cigarettes, chewing tobacco, and vaping devices, such as e-cigarettes. If you need help quitting, ask your health care provider. Keep all follow-up visits. This is important. Contact a health care provider if: You vomit. You have diarrhea. You have a fever for more than 2-3 days. You feel more thirsty than usual. You feel weak and tired. Get help right away if: You have chest pain. You have a fast or irregular heartbeat. You develop numbness in any part of your body. You cannot move your arms or your legs. You have trouble speaking. You become sweaty or feel light-headed. You faint. You feel short of breath. You have trouble staying awake. You feel confused. These symptoms may be an emergency. Get help right away. Call 911. Do not wait to see if the symptoms will go away. Do not drive yourself to the hospital. Summary Hypotension is when the force of blood pumping through the arteries is too weak. Hypotension may be harmless (benign) or may cause serious problems (be critical). Treatment for this condition may include changing your diet, changing  your medicines, and wearing compression stockings. In some cases, you may need to go to the hospital for fluid or blood replacement. This information is not intended to replace advice given to you by your health care provider. Make sure you discuss any questions you have with your health care provider. Document Revised: 12/15/2020 Document Reviewed: 12/15/2020 Elsevier Patient Education  2024 [employer].

## 2024-01-26 NOTE — Progress Notes (Signed)
 Virtual Visit Consent   Logan Wong, you are scheduled for a virtual visit with a [facility] provider today. Just as with appointments in the office, your consent must be obtained to participate. Your consent will be active for this visit and any virtual visit you may have with one of our providers in the next 365 days. If you have a MyChart account, a copy of this consent can be sent to you electronically.  As this is a virtual visit, video technology does not allow for your provider to perform a traditional examination. This may limit your provider's ability to fully assess your condition. If your provider identifies any concerns that need to be evaluated in person or the need to arrange testing (such as labs, EKG, etc.), we will make arrangements to do so. Although advances in technology are sophisticated, we cannot ensure that it will always work on either your end or our end. If the connection with a video visit is poor, the visit may have to be switched to a telephone visit. With either a video or telephone visit, we are not always able to ensure that we have a secure connection.  By engaging in this virtual visit, you consent to the provision of healthcare and authorize for your insurance to be billed (if applicable) for the services provided during this visit. Depending on your insurance coverage, you may receive a charge related to this service.  I need to obtain your verbal consent now. Are you willing to proceed with your visit today? Logan Wong has provided verbal consent on 01/26/2024 for a virtual visit (video or telephone). Bari Learn, FNP  Date: 01/26/2024 7:20 PM   Virtual Visit via Video Note   I, Bari Learn, connected with  Logan Wong  ([id number], 07-27-1950) on 01/26/24 at  7:15 PM EDT by a video-enabled telemedicine application and verified that I am speaking with the correct person using two identifiers.  Location: Patient: Virtual Visit Location Patient:  Home Provider: Virtual Visit Location Provider: Home Office   I discussed the limitations of evaluation and management by telemedicine and the availability of in person appointments. The patient expressed understanding and agreed to proceed.    History of Present Illness: Logan Wong is a 73 y.o. who identifies as a male who was assigned male at birth, and is being seen today for hypotension. He reports he checks his BP daily and his BP is usually 120/80. However, today he checked his BP today 96/75. He rechecked his BP just prior to our visit and it was 120/84.  He reports he has been trying to diet and exercise. He reports he has lost 15 lbs since June. Denies any dizziness, headaches, or edema. Denies any nausea, vomiting, or diarrhea.   HPI: HPI  Problems:  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   Malignant neoplasm of prostate (HCC) 04/20/2018    Allergies: No Active Allergies Medications:  Current Outpatient Medications:    albuterol  (VENTOLIN  HFA) 108 (90 Base) MCG/ACT inhaler, Inhale 1-2 puffs into the lungs every 6 (six) hours as needed for wheezing or shortness of breath., Disp: 54 g, Rfl: 3   amLODipine  (NORVASC ) 5 MG tablet, Take 1 tablet (5 mg total) by mouth daily., Disp: 90 tablet, Rfl: 3   Calcium-Vitamin D-Vitamin K (VIACTIV) 500-500-40 MG-UNT-MCG CHEW, Chew 1 tablet by mouth every morning., Disp: , Rfl:    diclofenac Sodium (VOLTAREN) 1 % GEL, , Disp: ,  Rfl:    ibandronate  (BONIVA ) 150 MG tablet, Take 1 tablet (150 mg total) by mouth every 30 (thirty) days. Take in the morning with a full glass of water, on an empty stomach, and do not take anything else by mouth or lie down for the next 30 min., Disp: 3 tablet, Rfl: 3   meloxicam  (MOBIC ) 7.5 MG tablet, Take 1 tablet (7.5 mg total) by mouth daily., Disp: 90 tablet, Rfl: 3   Multiple Vitamin (MULTIVITAMIN WITH MINERALS) TABS tablet, Take 1 tablet by mouth every morning.,  Disp: , Rfl:    pantoprazole  (PROTONIX ) 40 MG tablet, Take 1 tablet (40 mg total) by mouth every morning., Disp: 90 tablet, Rfl: 3   pravastatin  (PRAVACHOL ) 40 MG tablet, Take 1 tablet (40 mg total) by mouth every morning., Disp: 90 tablet, Rfl: 3   rOPINIRole  (REQUIP ) 1 MG tablet, Take 1 tablet by mouth 1-3 hours before bedtime, Disp: 90 tablet, Rfl: 0   tadalafil  (CIALIS ) 5 MG tablet, TAKE 1 TABLET BY MOUTH DAILY AS NEEDED FOR ERECTILE DYSFUNCTION, Disp: 90 tablet, Rfl: 0   valsartan -hydrochlorothiazide  (DIOVAN -HCT) 320-12.5 MG tablet, Take 1 tablet by mouth every morning., Disp: 90 tablet, Rfl: 3   venlafaxine  XR (EFFEXOR -XR) 150 MG 24 hr capsule, Take 1 capsule (150 mg total) by mouth daily with breakfast., Disp: 90 capsule, Rfl: 3  Observations/Objective: Patient is well-developed, well-nourished in no acute distress.  Resting comfortably  at home.  Head is normocephalic, atraumatic.  No labored breathing. Speech is clear and coherent with logical content.  Patient is alert and oriented at baseline.    Assessment and Plan: 1. Hypotension, unspecified hypotension type (Primary)  Force fluids BP improved now Continue low salt diet, exercise, and healthy diet  Pt was on plane yesterday and states he did not drink enough fluids like he should.  Continue to monitor BP at home. May need to stop hydrochlorothiazide  12.5 mg of BP continues to run lower.  Continue to follow up with PCP in Oct  Follow Up Instructions: I discussed the assessment and treatment plan with the patient. The patient was provided an opportunity to ask questions and all were answered. The patient agreed with the plan and demonstrated an understanding of the instructions.  A copy of instructions were sent to the patient via MyChart unless otherwise noted below.     The patient was advised to call back or seek an in-person evaluation if the symptoms worsen or if the condition fails to improve as anticipated.     Bari Learn, FNP

## 2024-02-10 DIAGNOSIS — R21 Rash and other nonspecific skin eruption: Secondary | ICD-10-CM | POA: Diagnosis not present

## 2024-02-15 ENCOUNTER — Other Ambulatory Visit: Payer: Self-pay | Admitting: Medical Genetics

## 2024-02-15 ENCOUNTER — Ambulatory Visit: Admitting: Family Medicine

## 2024-02-16 ENCOUNTER — Other Ambulatory Visit: Payer: Self-pay

## 2024-02-16 MED ORDER — PANTOPRAZOLE SODIUM 40 MG PO TBEC: 40.0000 mg | DELAYED_RELEASE_TABLET | Freq: Every morning | ORAL | 0 refills | Status: DC

## 2024-02-16 NOTE — Progress Notes (Addendum)
 [facility] [address], [address] [place], [place] [address] [phone]  Date:  02/20/2024   Name:  JD MCCASTER   DOB:  1950-08-20   MRN:  [id number]  PCP:  Watt Harlene BROCKS, MD    Chief Complaint: Annual Exam   History of Present Illness:  SHAILEN THIELEN is a 73 y.o. very pleasant male patient who presents with the following:  Patient seen today for physical exam.  I saw him most recently for virtual visit about a year ago At that time he was under quite a bit of stress, unfortunately his wife Annie's health declined, both mentally and physically.  She is now in a long-term care facility Logun himself has history of prostate cancer status post prostatectomy, hyperlipidemia, hypertension, osteoporosis but is otherwise generally in fairly good health.  Flu shot - done already  Recommend COVID booster Tetanus is due- reminded him to have this done at his pharmacy Colon cancer screening appears to be due- however his last scope was normal 10 years ago and he would like to do cologuard.  He denies any history of polyps or family history of colon cancer Can update labs today PSA checked 1 year ago, 0.10  Wt Readings from Last 3 Encounters:  02/20/24 160 lb 3.2 oz (72.7 kg)  11/29/23 162 lb (73.5 kg)  05/26/23 175 lb (79.4 kg)    Requip  Valsartan /hydrochlorothiazide  Venlafaxine  150  Discussed the use of AI scribe software for clinical note transcription with the patient, who gave verbal consent to proceed.  History of Present Illness KHAYREE DELELLIS is a 73 year old male who presents for a routine follow-up visit.  He has experienced a possible mild case of scabies on his arm, likely contracted from handling his wife's laundry, who is currently in a care facility experiencing an outbreak. His symptoms are almost resolved.  Zelda  has been in a memory care facility for about a year now.  Pilar goes over to see  her every day and notes they enjoy spending time together.  However he is doing much better without the strain is so much caregiving.  He has improved his diet, intentionally lost about 15 pounds, and is eating a lot more exercise.  He notes he feels better than he has in several years He had COVID-19 in July and was treated with Paxlovid at an urgent care facility, feeling significantly better within 36 hours.  He is currently taking several medications: Requip  (ropinirole ) at bedtime, pravastatin , venlafaxine  (Effexor ), valsartan , and pantoprazole  for GERD, which is well-controlled. He monitors his blood pressure daily, noting a trend towards lower readings, and has stopped taking amlodipine  due to low blood pressure readings.  He has already received his flu shot. He prefers to use Cologuard for colon cancer screening. He has no family history of colon cancer and no previous issues with polyps.  No chest pain, shortness of breath, or palpitations. No issues with GERD.  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   Malignant neoplasm of prostate (HCC) 04/20/2018    Past Medical History:  Diagnosis Date   Allergies    Allergy    Anxiety    Cancer (HCC)    Depression    GERD (gastroesophageal reflux disease)    Heart murmur    Hyperlipidemia    Hypertension    Osteoarthritis    Osteoporosis  Past Surgical History:  Procedure Laterality Date   PROSTATECTOMY  2019   ROTATOR CUFF REPAIR Right 2010   THULIUM LASER TURP (TRANSURETHRAL RESECTION OF PROSTATE)  2007    Social History   Tobacco Use   Smoking status: Former    Current packs/day: 0.00    Average packs/day: 1 pack/day for 12.0 years (12.0 ttl pk-yrs)    Types: Cigarettes    Start date: 44    Quit date: 81    Years since quitting: 39.8   Smokeless tobacco: Never  Vaping Use   Vaping status: Never Used  Substance Use Topics   Alcohol use: Not Currently     Comment: special occasion only   Drug use: No    Family History  Problem Relation Age of Onset   Hypertension Mother    Osteoarthritis Mother    COPD Mother    Heart attack Mother    Heart attack Father    COPD Father    Cancer Father    Cancer Sister    Early death Sister    Early death Sister    Colon polyps Neg Hx    Colon cancer Neg Hx    Esophageal cancer Neg Hx    Rectal cancer Neg Hx    Stomach cancer Neg Hx     No Active Allergies  Medication list has been reviewed and updated.  Current Outpatient Medications on File Prior to Visit  Medication Sig Dispense Refill   albuterol  (VENTOLIN  HFA) 108 (90 Base) MCG/ACT inhaler Inhale 1-2 puffs into the lungs every 6 (six) hours as needed for wheezing or shortness of breath. 54 g 3   Calcium-Vitamin D-Vitamin K (VIACTIV) 500-500-40 MG-UNT-MCG CHEW Chew 1 tablet by mouth every morning.     ibandronate  (BONIVA ) 150 MG tablet Take 1 tablet (150 mg total) by mouth every 30 (thirty) days. Take in the morning with a full glass of water, on an empty stomach, and do not take anything else by mouth or lie down for the next 30 min. 3 tablet 3   meloxicam  (MOBIC ) 7.5 MG tablet Take 1 tablet (7.5 mg total) by mouth daily. 90 tablet 3   Multiple Vitamin (MULTIVITAMIN WITH MINERALS) TABS tablet Take 1 tablet by mouth every morning.     tadalafil  (CIALIS ) 5 MG tablet TAKE 1 TABLET BY MOUTH DAILY AS NEEDED FOR ERECTILE DYSFUNCTION 90 tablet 0   diclofenac Sodium (VOLTAREN) 1 % GEL      No current facility-administered medications on file prior to visit.    Review of Systems:  As per HPI- otherwise negative.   Physical Examination: Vitals:   02/20/24 1345  BP: 122/76  Pulse: 85  Temp: 98 F (36.7 C)  SpO2: 97%   Vitals:   02/20/24 1345  Weight: 160 lb 3.2 oz (72.7 kg)  Height: 5' 8 (1.727 m)   Body mass index is 24.36 kg/m. Ideal Body Weight: Weight in (lb) to have BMI = 25: 164.1  GEN: no acute distress.  Looks well,  has lost weight HEENT: Atraumatic, Normocephalic. Bilateral TM wnl, oropharynx normal.  PEERL,EOMI.   Ears and Nose: No external deformity. CV: Noted an irregularly irregular pulse today, No M/G/R. No JVD. No thrill. No extra heart sounds. PULM: CTA B, no wheezes, crackles, rhonchi. No retractions. No resp. distress. No accessory muscle use. ABD: S, NT, ND, +BS. No rebound. No HSM. EXTR: No c/c/e PSYCH: Normally interactive. Conversant.   EKG: irregular rhythm Discussed with cardiology who noted NOT  afib -sinus arrhythmia.  There is suspicion of atrial enlargement, will obtain an echo  Assessment and Plan: Physical exam  Anxiety and depression - Plan: rOPINIRole  (REQUIP ) 1 MG tablet, venlafaxine  XR (EFFEXOR -XR) 150 MG 24 hr capsule  Caregiver stress  History of prostate cancer - Plan: PSA  Essential hypertension - Plan: CBC, Comprehensive metabolic panel with GFR, valsartan -hydrochlorothiazide  (DIOVAN -HCT) 320-12.5 MG tablet  Screening for diabetes mellitus - Plan: Comprehensive metabolic panel with GFR, Hemoglobin A1c  Hyperlipidemia, unspecified hyperlipidemia type - Plan: Lipid panel, pravastatin  (PRAVACHOL ) 40 MG tablet  Osteoporosis, unspecified osteoporosis type, unspecified pathological fracture presence  Screening for colon cancer - Plan: Cologuard  Gastroesophageal reflux disease, unspecified whether esophagitis present - Plan: pantoprazole  (PROTONIX ) 40 MG tablet  Irregularly irregular pulse rhythm - Plan: EKG 12-Lead, Ambulatory referral to Cardiology, ECHOCARDIOGRAM COMPLETE, TSH  Assessment & Plan Adult Wellness Visit Routine wellness visit with intentional weight loss and increased energy levels. - Order blood work: CBC, metabolic panel, A1c, cholesterol, PSA. - Order Cologuard kit for colon cancer screening. - Recommend tetanus booster at pharmacy.  Cardiac arrhythmia (sinus irregularity) EKG showed sinus irregularity. Cardiologist recommended echocardiogram  to rule out underlying issues. - Order echocardiogram. - Refer to cardiology for further evaluation. - Check thyroid  function as part of blood work. Issacc is feeling well, if any shortness of breath or chest pain he will let me know  Essential hypertension Blood pressure well-controlled. Decision to discontinue amlodipine -blood pressure improved due to weight loss and getting more exercise - Discontinue amlodipine . - Continue valsartan /HCTZ - Monitor blood pressure regularly.  Hyperlipidemia Well-managed on pravastatin . - Continue pravastatin .  Depression and anxiety Improved energy and mood with lifestyle changes. Well-managed on venlafaxine . - Continue venlafaxine .  Gastroesophageal reflux disease (GERD) Symptoms well-controlled with pantoprazole . - Continue pantoprazole .  Restless legs syndrome Well-managed on ropinirole . - Continue ropinirole .  Signed Harlene Schroeder, MD  Addendum 10/14, received labs as below.  Message to patient  Results for orders placed or performed in visit on 02/20/24  CBC   Collection Time: 02/20/24  2:39 PM  Result Value Ref Range   WBC 7.5 4.0 - 10.5 K/uL   RBC 4.88 4.22 - 5.81 Mil/uL   Platelets 274.0 150.0 - 400.0 K/uL   Hemoglobin 14.5 13.0 - 17.0 g/dL   HCT 55.9 60.9 - 47.9 %   MCV 90.2 78.0 - 100.0 fl   MCHC 32.9 30.0 - 36.0 g/dL   RDW 86.2 88.4 - 84.4 %  Comprehensive metabolic panel with GFR   Collection Time: 02/20/24  2:39 PM  Result Value Ref Range   Sodium 140 135 - 145 mEq/L   Potassium 3.5 3.5 - 5.1 mEq/L   Chloride 99 96 - 112 mEq/L   CO2 33 (H) 19 - 32 mEq/L   Glucose, Bld 98 70 - 99 mg/dL   BUN 19 6 - 23 mg/dL   Creatinine, Ser 9.09 0.40 - 1.50 mg/dL   Total Bilirubin 0.5 0.2 - 1.2 mg/dL   Alkaline Phosphatase 62 39 - 117 U/L   AST 23 0 - 37 U/L   ALT 25 0 - 53 U/L   Total Protein 6.8 6.0 - 8.3 g/dL   Albumin 4.4 3.5 - 5.2 g/dL   GFR 14.53 >39.99 mL/min   Calcium 9.1 8.4 - 10.5 mg/dL  Hemoglobin J8r    Collection Time: 02/20/24  2:39 PM  Result Value Ref Range   Hgb A1c MFr Bld 6.1 4.6 - 6.5 %  Lipid panel   Collection Time:  02/20/24  2:39 PM  Result Value Ref Range   Cholesterol 174 0 - 200 mg/dL   Triglycerides 21.9 0.0 - 149.0 mg/dL   HDL 23.99 >60.99 mg/dL   VLDL 84.3 0.0 - 59.9 mg/dL   LDL Cholesterol 83 0 - 99 mg/dL   Total CHOL/HDL Ratio 2    NonHDL 98.19   PSA   Collection Time: 02/20/24  2:39 PM  Result Value Ref Range   PSA 0.34 0.10 - 4.00 ng/mL  TSH   Collection Time: 02/20/24  2:39 PM  Result Value Ref Range   TSH 1.34 0.35 - 5.50 uIU/mL

## 2024-02-16 NOTE — Patient Instructions (Addendum)
 Good to see you again today, I will be in touch with your labs Recommend flu shot this season if not done already, also a COVID-19 booster and tetanus booster at your pharmacy Also recommend 1 dose RSV if not done already  Ordered a cologuard kit to come to his home   We will get you set up for an echocardiogram and to see cardiology

## 2024-02-20 ENCOUNTER — Encounter: Payer: Self-pay | Admitting: Family Medicine

## 2024-02-20 ENCOUNTER — Ambulatory Visit (INDEPENDENT_AMBULATORY_CARE_PROVIDER_SITE_OTHER): Admitting: Family Medicine

## 2024-02-20 ENCOUNTER — Encounter: Admitting: Family Medicine

## 2024-02-20 ENCOUNTER — Telehealth: Payer: Self-pay | Admitting: Family Medicine

## 2024-02-20 VITALS — BP 122/76 | HR 85 | Temp 98.0°F | Ht 68.0 in | Wt 160.2 lb

## 2024-02-20 DIAGNOSIS — Z8546 Personal history of malignant neoplasm of prostate: Secondary | ICD-10-CM

## 2024-02-20 DIAGNOSIS — M810 Age-related osteoporosis without current pathological fracture: Secondary | ICD-10-CM

## 2024-02-20 DIAGNOSIS — E785 Hyperlipidemia, unspecified: Secondary | ICD-10-CM

## 2024-02-20 DIAGNOSIS — K219 Gastro-esophageal reflux disease without esophagitis: Secondary | ICD-10-CM

## 2024-02-20 DIAGNOSIS — Z131 Encounter for screening for diabetes mellitus: Secondary | ICD-10-CM

## 2024-02-20 DIAGNOSIS — Z0000 Encounter for general adult medical examination without abnormal findings: Secondary | ICD-10-CM

## 2024-02-20 DIAGNOSIS — Z636 Dependent relative needing care at home: Secondary | ICD-10-CM

## 2024-02-20 DIAGNOSIS — I10 Essential (primary) hypertension: Secondary | ICD-10-CM | POA: Diagnosis not present

## 2024-02-20 DIAGNOSIS — I499 Cardiac arrhythmia, unspecified: Secondary | ICD-10-CM

## 2024-02-20 DIAGNOSIS — Z1211 Encounter for screening for malignant neoplasm of colon: Secondary | ICD-10-CM

## 2024-02-20 DIAGNOSIS — F419 Anxiety disorder, unspecified: Secondary | ICD-10-CM | POA: Diagnosis not present

## 2024-02-20 DIAGNOSIS — F32A Depression, unspecified: Secondary | ICD-10-CM

## 2024-02-20 MED ORDER — VENLAFAXINE HCL ER 150 MG PO CP24: 150.0000 mg | ORAL_CAPSULE | Freq: Every day | ORAL | 3 refills | Status: AC

## 2024-02-20 MED ORDER — PRAVASTATIN SODIUM 40 MG PO TABS: 40.0000 mg | ORAL_TABLET | Freq: Every morning | ORAL | 3 refills | Status: AC

## 2024-02-20 MED ORDER — ROPINIROLE HCL 1 MG PO TABS: ORAL_TABLET | ORAL | 3 refills | Status: AC

## 2024-02-20 MED ORDER — PANTOPRAZOLE SODIUM 40 MG PO TBEC: 40.0000 mg | DELAYED_RELEASE_TABLET | Freq: Every morning | ORAL | 3 refills | Status: AC

## 2024-02-20 MED ORDER — VALSARTAN-HYDROCHLOROTHIAZIDE 320-12.5 MG PO TABS: 1.0000 | ORAL_TABLET | Freq: Every morning | ORAL | 3 refills | Status: AC

## 2024-02-21 ENCOUNTER — Encounter: Payer: Self-pay | Admitting: Family Medicine

## 2024-02-21 DIAGNOSIS — Z8546 Personal history of malignant neoplasm of prostate: Secondary | ICD-10-CM

## 2024-02-21 LAB — COMPREHENSIVE METABOLIC PANEL WITH GFR
ALT: 25 U/L (ref 0–53)
AST: 23 U/L (ref 0–37)
Albumin: 4.4 g/dL (ref 3.5–5.2)
Alkaline Phosphatase: 62 U/L (ref 39–117)
BUN: 19 mg/dL (ref 6–23)
CO2: 33 meq/L — ABNORMAL HIGH (ref 19–32)
Calcium: 9.1 mg/dL (ref 8.4–10.5)
Chloride: 99 meq/L (ref 96–112)
Creatinine, Ser: 0.9 mg/dL (ref 0.40–1.50)
GFR: 85.46 mL/min (ref >60.00)
Glucose, Bld: 98 mg/dL (ref 70–99)
Potassium: 3.5 meq/L (ref 3.5–5.1)
Sodium: 140 meq/L (ref 135–145)
Total Bilirubin: 0.5 mg/dL (ref 0.2–1.2)
Total Protein: 6.8 g/dL (ref 6.0–8.3)

## 2024-02-21 LAB — TSH: TSH: 1.34 u[IU]/mL (ref 0.35–5.50)

## 2024-02-21 LAB — CBC
HCT: 44 % (ref 39.0–52.0)
Hemoglobin: 14.5 g/dL (ref 13.0–17.0)
MCHC: 32.9 g/dL (ref 30.0–36.0)
MCV: 90.2 fl (ref 78.0–100.0)
Platelets: 274 K/uL (ref 150.0–400.0)
RBC: 4.88 Mil/uL (ref 4.22–5.81)
RDW: 13.7 % (ref 11.5–15.5)
WBC: 7.5 K/uL (ref 4.0–10.5)

## 2024-02-21 LAB — LIPID PANEL
Cholesterol: 174 mg/dL (ref 0–200)
HDL: 76 mg/dL (ref >39.00)
LDL Cholesterol: 83 mg/dL (ref 0–99)
NonHDL: 98.19
Total CHOL/HDL Ratio: 2
Triglycerides: 78 mg/dL (ref 0.0–149.0)
VLDL: 15.6 mg/dL (ref 0.0–40.0)

## 2024-02-21 LAB — HEMOGLOBIN A1C: Hgb A1c MFr Bld: 6.1 % (ref 4.6–6.5)

## 2024-02-21 LAB — PSA: PSA: 0.34 ng/mL (ref 0.10–4.00)

## 2024-02-21 NOTE — Telephone Encounter (Signed)
 NA

## 2024-02-27 DIAGNOSIS — Z1211 Encounter for screening for malignant neoplasm of colon: Secondary | ICD-10-CM | POA: Diagnosis not present

## 2024-02-28 ENCOUNTER — Ambulatory Visit: Payer: Self-pay

## 2024-02-28 NOTE — Telephone Encounter (Signed)
 Noted

## 2024-02-28 NOTE — Telephone Encounter (Signed)
 FYI Only or Action Required?: FYI only for provider.  Patient was last seen in primary care on 02/20/2024 by Copland, Harlene BROCKS, MD.  Called Nurse Triage reporting Nasal Congestion.  Symptoms began several days ago.  Interventions attempted: OTC medications: Saline spray.  Symptoms are: unchanged.  Triage Disposition: Home Care  Patient/caregiver understands and will follow disposition?: Yes  **See note below**          Copied from CRM #[id number]. Topic: Clinical - Red Word Triage >> Feb 28, 2024 11:30 AM Logan Wong wrote: [facility] that prompted transfer to Nurse Triage: Patient thinks he has Sinus Infection he can not breathe through his nose well. Has congestion and head hurts from time to time has been going on since last Wednesday. Reason for Disposition  [1] Sinus congestion as part of Wong cold AND [2] present < 10 days  Answer Assessment - Initial Assessment Questions 1. LOCATION: Where does it hurt?      Nasal area   2. ONSET: When did the sinus pain start?  (e.g., hours, days)      Last Wednesday   3. SEVERITY: How bad is the pain?   (Scale 0-10; or none, mild, moderate or severe)     Mild   4. RECURRENT SYMPTOM: Have you ever had sinus problems before? If Yes, ask: When was the last time? and What happened that time?       Yes, he stated Wong long time ago   5. NASAL CONGESTION: Is the nose blocked? If Yes, ask: Can you open it or must you breathe through your mouth?     He can breathe through the nares, and suspects it is swollen/ irritated  6. NASAL DISCHARGE: Do you have discharge from your nose? If so ask, What color?     No   7. FEVER: Do you have Wong fever? If Yes, ask: What is it, how was it measured, and when did it start?      No   8. OTHER SYMPTOMS: Do you have any other symptoms? (e.g., sore throat, cough, earache, difficulty breathing) No   Using saline spray to assist with relief, no relief noted however he just started  the saline spray today. Home care advice given; he will reach back out if symptoms persist or worsen.  Protocols used: Sinus Pain or Congestion-Wong-AH

## 2024-02-29 NOTE — Progress Notes (Signed)
 [facility] [employer] [address], [address] [place], [place] [address] [phone]  Date:  03/01/2024   Name:  Logan Wong   DOB:  04-03-1951   MRN:  [id number]  PCP:  Watt Harlene BROCKS, MD    Chief Complaint: Nasal Congestion (Onset one week Roderic my nose )   History of Present Illness:  Logan Wong is a 73 y.o. very pleasant male patient who presents with the following:  Patient seen today with concern of nasal congestion and associated discomfort.  I saw him just recently for his physical exam- unfortunately his wife Annie's health declined, both mentally and physically.  She is now in a long-term care facility Brycin himself has history of prostate cancer status post prostatectomy, hyperlipidemia, hypertension, osteoporosis but is otherwise generally in fairly good health.  Everything looked good except his PSA was higher than expected after prostatectomy so I referred him back to urology  Discussed the use of AI scribe software for clinical note transcription with the patient, who gave verbal consent to proceed.  History of Present Illness Logan Wong is a 73 year old male who presents with nasal congestion and irritation.  His symptoms began a week ago, initially resembling a head cold with a runny nose and sneezing, but without coughing. Over the weekend, the symptoms persisted, and as the week progressed, he experienced increased nasal congestion and irritation.  He uses a saline solution to manage his symptoms. When he blows his nose, sometimes nothing comes out, but at other times, there is a significant discharge with chunks and occasionally a tinge of blood, though no flowing blood. He has been using Azelastine hydrochloride spray from a previous COVID-19 infection in the summer, which provides temporary relief. He is cautious not to overuse it and relies more on saline spray for irrigation. He also takes  ibuprofen occasionally, which helps relieve itchiness but not pressure.  No cough, fever, chills, sore throat, or flu-like symptoms. No pain or pressure when bending down, and the symptoms are localized to his nose without further extension.    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   Malignant neoplasm of prostate (HCC) 04/20/2018    Past Medical History:  Diagnosis Date   Allergies    Allergy    Anxiety    Cancer (HCC)    Depression    GERD (gastroesophageal reflux disease)    Heart murmur    Hyperlipidemia    Hypertension    Osteoarthritis    Osteoporosis     Past Surgical History:  Procedure Laterality Date   PROSTATECTOMY  2019   ROTATOR CUFF REPAIR Right 2010   THULIUM LASER TURP (TRANSURETHRAL RESECTION OF PROSTATE)  2007    Social History   Tobacco Use   Smoking status: Former    Current packs/day: 0.00    Average packs/day: 1 pack/day for 12.0 years (12.0 ttl pk-yrs)    Types: Cigarettes    Start date: 62    Quit date: 58    Years since quitting: 39.8   Smokeless tobacco: Never  Vaping Use   Vaping status: Never Used  Substance Use Topics   Alcohol use: Not Currently    Comment: special occasion only   Drug use: No    Family History  Problem Relation Age of Onset   Hypertension Mother    Osteoarthritis Mother    COPD Mother  Heart attack Mother    Heart attack Father    COPD Father    Cancer Father    Cancer Sister    Early death Sister    Early death Sister    Colon polyps Neg Hx    Colon cancer Neg Hx    Esophageal cancer Neg Hx    Rectal cancer Neg Hx    Stomach cancer Neg Hx     No Known Allergies  Medication list has been reviewed and updated.  Current Outpatient Medications on File Prior to Visit  Medication Sig Dispense Refill   albuterol  (VENTOLIN  HFA) 108 (90 Base) MCG/ACT inhaler Inhale 1-2 puffs into the lungs every 6 (six) hours as needed for wheezing or shortness  of breath. 54 g 3   Calcium-Vitamin D-Vitamin K (VIACTIV) 500-500-40 MG-UNT-MCG CHEW Chew 1 tablet by mouth every morning.     ibandronate  (BONIVA ) 150 MG tablet Take 1 tablet (150 mg total) by mouth every 30 (thirty) days. Take in the morning with a full glass of water, on an empty stomach, and do not take anything else by mouth or lie down for the next 30 min. 3 tablet 3   meloxicam  (MOBIC ) 7.5 MG tablet Take 1 tablet (7.5 mg total) by mouth daily. 90 tablet 3   Multiple Vitamin (MULTIVITAMIN WITH MINERALS) TABS tablet Take 1 tablet by mouth every morning.     pantoprazole  (PROTONIX ) 40 MG tablet Take 1 tablet (40 mg total) by mouth every morning. 90 tablet 3   pravastatin  (PRAVACHOL ) 40 MG tablet Take 1 tablet (40 mg total) by mouth every morning. 90 tablet 3   rOPINIRole  (REQUIP ) 1 MG tablet Take 1 tablet by mouth 1-3 hours before bedtime 90 tablet 3   tadalafil  (CIALIS ) 5 MG tablet TAKE 1 TABLET BY MOUTH DAILY AS NEEDED FOR ERECTILE DYSFUNCTION 90 tablet 0   valsartan -hydrochlorothiazide  (DIOVAN -HCT) 320-12.5 MG tablet Take 1 tablet by mouth every morning. 90 tablet 3   venlafaxine  XR (EFFEXOR -XR) 150 MG 24 hr capsule Take 1 capsule (150 mg total) by mouth daily with breakfast. 90 capsule 3   diclofenac Sodium (VOLTAREN) 1 % GEL      No current facility-administered medications on file prior to visit.    Review of Systems:  As per HPI- otherwise negative.   Physical Examination: Vitals:   03/01/24 0857  BP: 126/74  Pulse: 74  Temp: 97.9 F (36.6 C)  SpO2: 93%   Vitals:   03/01/24 0857  Weight: 161 lb 9.6 oz (73.3 kg)  Height: 5' 8 (1.727 m)   Body mass index is 24.57 kg/m. Ideal Body Weight: Weight in (lb) to have BMI = 25: 164.1  GEN: no acute distress. Normal weight, looks well  HEENT: Atraumatic, Normocephalic.  Bilateral TM wnl, oropharynx normal.  PEERL,EOMI.  Nasal cavity is inflamed and congested  Ears and Nose: No external deformity. CV: RRR, No M/G/R. No JVD.  No thrill. No extra heart sounds. PULM: CTA B, no wheezes, crackles, rhonchi. No retractions. No resp. distress. No accessory muscle use. EXTR: No c/c/e PSYCH: Normally interactive. Conversant.    Assessment and Plan: Acute recurrent frontal sinusitis - Plan: amoxicillin  (AMOXIL ) 875 MG tablet  Assessment & Plan Acute sinusitis Symptoms suggest acute sinusitis, likely viral but bacterial infection considered due to duration. - Prescribed amoxicillin  500 mg orally twice daily for 10 days. - Advised continuation of saline nasal spray. - Instructed to contact if no improvement by Monday or if symptoms worsen. He has his  granddaughters wedding next weekend!  - Sent prescription to [facility] on [address].  Signed Harlene Schroeder, MD "

## 2024-03-01 ENCOUNTER — Encounter: Payer: Self-pay | Admitting: Family Medicine

## 2024-03-01 ENCOUNTER — Ambulatory Visit (INDEPENDENT_AMBULATORY_CARE_PROVIDER_SITE_OTHER): Admitting: Family Medicine

## 2024-03-01 VITALS — BP 126/74 | HR 74 | Temp 97.9°F | Ht 68.0 in | Wt 161.6 lb

## 2024-03-01 DIAGNOSIS — J0111 Acute recurrent frontal sinusitis: Secondary | ICD-10-CM

## 2024-03-01 MED ORDER — AMOXICILLIN 875 MG PO TABS: 875.0000 mg | ORAL_TABLET | Freq: Two times a day (BID) | ORAL | 0 refills | Status: DC

## 2024-03-02 LAB — COLOGUARD: COLOGUARD: NEGATIVE

## 2024-03-03 ENCOUNTER — Encounter: Payer: Self-pay | Admitting: Family Medicine

## 2024-03-06 ENCOUNTER — Other Ambulatory Visit: Payer: Self-pay | Admitting: Family Medicine

## 2024-03-07 ENCOUNTER — Encounter: Payer: Self-pay | Admitting: Family Medicine

## 2024-03-07 ENCOUNTER — Ambulatory Visit (HOSPITAL_COMMUNITY)
Admission: RE | Admit: 2024-03-07 | Discharge: 2024-03-07 | Disposition: A | Discharge status: Home / Self Care | Source: Ambulatory Visit | Attending: Family Medicine | Admitting: Family Medicine

## 2024-03-07 DIAGNOSIS — R9431 Abnormal electrocardiogram [ECG] [EKG]: Secondary | ICD-10-CM | POA: Insufficient documentation

## 2024-03-07 DIAGNOSIS — I10 Essential (primary) hypertension: Secondary | ICD-10-CM | POA: Insufficient documentation

## 2024-03-07 DIAGNOSIS — I499 Cardiac arrhythmia, unspecified: Secondary | ICD-10-CM | POA: Diagnosis not present

## 2024-03-07 DIAGNOSIS — E785 Hyperlipidemia, unspecified: Secondary | ICD-10-CM | POA: Diagnosis not present

## 2024-03-07 LAB — ECHOCARDIOGRAM COMPLETE: S' Lateral: 2.4 cm

## 2024-03-20 NOTE — Progress Notes (Unsigned)
 Chief Complaint: No chief complaint on file.   History of Present Illness:  Logan Wong is a 73 y.o. male who is seen in consultation from Copland, Harlene BROCKS, MD for evaluation of ***.   Past Medical History:  Past Medical History:  Diagnosis Date   Allergies    Allergy    Anxiety    Cancer (HCC)    Depression    GERD (gastroesophageal reflux disease)    Heart murmur    Hyperlipidemia    Hypertension    Osteoarthritis    Osteoporosis     Past Surgical History:  Past Surgical History:  Procedure Laterality Date   PROSTATECTOMY  2019   ROTATOR CUFF REPAIR Right 2010   THULIUM LASER TURP (TRANSURETHRAL RESECTION OF PROSTATE)  2007    Allergies:  No Known Allergies  Family History:  Family History  Problem Relation Age of Onset   Hypertension Mother    Osteoarthritis Mother    COPD Mother    Heart attack Mother    Heart attack Father    COPD Father    Cancer Father    Cancer Sister    Early death Sister    Early death Sister    Colon polyps Neg Hx    Colon cancer Neg Hx    Esophageal cancer Neg Hx    Rectal cancer Neg Hx    Stomach cancer Neg Hx     Social History:  Social History   Tobacco Use   Smoking status: Former    Current packs/day: 0.00    Average packs/day: 1 pack/day for 12.0 years (12.0 ttl pk-yrs)    Types: Cigarettes    Start date: 3    Quit date: 1986    Years since quitting: 39.8   Smokeless tobacco: Never  Vaping Use   Vaping status: Never Used  Substance Use Topics   Alcohol use: Not Currently    Comment: special occasion only   Drug use: No    Review of symptoms:  Constitutional:  Negative for unexplained weight loss, night sweats, fever, chills ENT:  Negative for nose bleeds, sinus pain, painful swallowing CV:  Negative for chest pain, shortness of breath, exercise intolerance, palpitations, loss of consciousness Resp:  Negative for cough, wheezing, shortness of breath GI:  Negative for nausea, vomiting,  diarrhea, bloody stools GU:  Positives noted in HPI; otherwise negative for gross hematuria, dysuria, urinary incontinence Neuro:  Negative for seizures, poor balance, limb weakness, slurred speech Psych:  Negative for lack of energy, depression, anxiety Endocrine:  Negative for polydipsia, polyuria, symptoms of hypoglycemia (dizziness, hunger, sweating) Hematologic:  Negative for anemia, purpura, petechia, prolonged or excessive bleeding, use of anticoagulants  Allergic:  Negative for difficulty breathing or choking as a result of exposure to anything; no shellfish allergy; no allergic response (rash/itch) to materials, foods  Physical exam: There were no vitals taken for this visit. GENERAL APPEARANCE:  Well appearing, well developed, well nourished, NAD HEENT: Atraumatic, Normocephalic. NECK: Normal appearance LUNGS: Normal inspiratory and expiratory excursion HEART: Regular Rate ABDOMEN: ***. GU: Phallus normal, no lesions. Scrotal skin normal. Testicles/epididymal structures normal. Meatus normal. Normal anal sphincter tone, prostate ***mL, symmetric, non nodular, non tender. EXTREMITIES: Moves all extremities well.  Without clubbing, cyanosis, or edema. NEUROLOGIC:  Alert and oriented x 3, normal gait, CN II-XII grossly intact.  MENTAL STATUS:  Appropriate. SKIN:  Warm, dry and intact.    Results: No results found for this or any previous visit (from  the past 24 hours).  I have reviewed referring/prior physicians notes  I have reviewed urinalysis  I have reviewed PSA results  I have reviewed prior imaging  I have reviewed urine culture results  Assessment: ***   Plan: ***

## 2024-03-21 ENCOUNTER — Ambulatory Visit: Admitting: Urology

## 2024-03-21 VITALS — BP 147/79 | HR 75 | Ht 68.0 in | Wt 161.0 lb

## 2024-03-21 DIAGNOSIS — Z8546 Personal history of malignant neoplasm of prostate: Secondary | ICD-10-CM

## 2024-03-21 DIAGNOSIS — R9721 Rising PSA following treatment for malignant neoplasm of prostate: Secondary | ICD-10-CM | POA: Diagnosis not present

## 2024-03-21 LAB — URINALYSIS, ROUTINE W REFLEX MICROSCOPIC
Bilirubin, UA: NEGATIVE
Glucose, UA: NEGATIVE
Ketones, UA: NEGATIVE
Leukocytes,UA: NEGATIVE
Nitrite, UA: NEGATIVE
Protein,UA: NEGATIVE
RBC, UA: NEGATIVE
Specific Gravity, UA: 1.015 (ref 1.005–1.030)
Urobilinogen, Ur: 0.2 mg/dL (ref 0.2–1.0)
pH, UA: 6 (ref 5.0–7.5)

## 2024-03-21 NOTE — Progress Notes (Signed)
 Chief Complaint:  Rising PSA after prostatectomy   History of Present Illness:  73 year old male sent by Dr. Koplan for evaluation and management of a rising PSA.  He underwent a perineal prostatectomy for prostate cancer by Dr. Cherly in [facility] in 2019.  Prior to his surgery his PSA was 5.3.  Final pathology revealed stage T2c adenocarcinoma, Gleason 3+4.  PSA was undetectable for quite some time.  He has not seen his urologist in 2 years.  Most recent PSA data:  12/2021--0.06 01/2023--0.10 02/2024--0.34  He has no significant leakage.  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  Not an issue at this point.  He has a wife with moderate dementia.  Past Medical History:  Past Medical History:  Diagnosis Date   Allergies    Allergy    Anxiety    Cancer (HCC)    Depression    GERD (gastroesophageal reflux disease)    Heart murmur    Hyperlipidemia    Hypertension    Osteoarthritis    Osteoporosis     Past Surgical History:  Past Surgical History:  Procedure Laterality Date   PROSTATECTOMY  2019   ROTATOR CUFF REPAIR Right 2010   THULIUM LASER TURP (TRANSURETHRAL RESECTION OF PROSTATE)  2007    Allergies:  No Known Allergies  Family History:  Family History  Problem Relation Age of Onset   Hypertension Mother    Osteoarthritis Mother    COPD Mother    Heart attack Mother    Heart attack Father    COPD Father    Cancer Father    Cancer Sister    Early death Sister    Early death Sister    Colon polyps Neg Hx    Colon cancer Neg Hx    Esophageal cancer Neg Hx    Rectal cancer Neg Hx    Stomach cancer Neg Hx     Social History:  Social History   Tobacco Use   Smoking status: Former    Current packs/day: 0.00    Average packs/day: 1 pack/day for 12.0 years (12.0 ttl pk-yrs)    Types: Cigarettes    Start date: 33    Quit date: 1986    Years since quitting: 39.8   Smokeless tobacco: Never  Vaping Use   Vaping status: Never Used  Substance Use Topics    Alcohol use: Not Currently    Comment: special occasion only   Drug use: No    Review of symptoms:  Constitutional:  Negative for unexplained weight loss, night sweats, fever, chills ENT:  Negative for nose bleeds, sinus pain, painful swallowing CV:  Negative for chest pain, shortness of breath, exercise intolerance, palpitations, loss of consciousness Resp:  Negative for cough, wheezing, shortness of breath GI:  Negative for nausea, vomiting, diarrhea, bloody stools GU:  Positives noted in HPI; otherwise negative for gross hematuria, dysuria, urinary incontinence Neuro:  Negative for seizures, poor balance, limb weakness, slurred speech Psych:  Negative for lack of energy, depression, anxiety Endocrine:  Negative for polydipsia, polyuria, symptoms of hypoglycemia (dizziness, hunger, sweating) Hematologic:  Negative for anemia, purpura, petechia, prolonged or excessive bleeding, use of anticoagulants  Allergic:  Negative for difficulty breathing or choking as a result of exposure to anything; no shellfish allergy; no allergic response (rash/itch) to materials, foods  Physical exam: BP (!) 147/79   Pulse 75   Ht 5' 8 (1.727 m)   Wt 161 lb (73 kg)   BMI 24.48 kg/m  GENERAL  APPEARANCE:  Well appearing, well developed, well nourished, NAD HEENT: Atraumatic, Normocephalic. NECK: Normal appearance LUNGS: Normal inspiratory and expiratory excursion HEART: Regular Rate EXTREMITIES: Moves all extremities well.  Without clubbing, cyanosis, or edema. NEUROLOGIC:  Alert and oriented x 3, normal gait, CN II-XII grossly intact.  MENTAL STATUS:  Appropriate. SKIN:  Warm, dry and intact.    Results:   I have reviewed referring/prior physicians notes  I have reviewed urinalysis--clear  I have reviewed PSA results  I have reviewed prior imaging--CT prior to his prostatectomy was negative for extraprostatic disease  I have reviewed IPSS sheet-4/0  Assessment: Grade group 2 prostate  cancer, status post perineal prostatectomy December 04/2018.  Initially he had an undetectable PSA, there has been a steady climb over the past 2 years.   Plan: I discussed with Argie the fact that he may have residual benign prostate tissue, but that he could have recurrent disease as well.  At this point, I think it is worthwhile to perform/proceed with PSMA PET CT scan.  We will get that scheduled, I will let him know the results  If positive, consider salvage radiation

## 2024-03-28 ENCOUNTER — Encounter: Payer: Self-pay | Admitting: Family Medicine

## 2024-03-28 DIAGNOSIS — M19041 Primary osteoarthritis, right hand: Secondary | ICD-10-CM

## 2024-03-28 MED ORDER — MELOXICAM 7.5 MG PO TABS: 7.5000 mg | ORAL_TABLET | Freq: Every day | ORAL | 3 refills | Status: AC

## 2024-04-02 ENCOUNTER — Other Ambulatory Visit: Payer: Self-pay

## 2024-04-02 DIAGNOSIS — R062 Wheezing: Secondary | ICD-10-CM

## 2024-04-02 MED ORDER — ALBUTEROL SULFATE HFA 108 (90 BASE) MCG/ACT IN AERS
1.0000 | INHALATION_SPRAY | Freq: Four times a day (QID) | RESPIRATORY_TRACT | 3 refills | Status: AC | PRN
Start: 2024-04-02 — End: ?

## 2024-04-09 ENCOUNTER — Encounter: Payer: Self-pay | Admitting: *Deleted

## 2024-04-09 ENCOUNTER — Ambulatory Visit: Admitting: Cardiology

## 2024-04-09 DIAGNOSIS — E559 Vitamin D deficiency, unspecified: Secondary | ICD-10-CM | POA: Insufficient documentation

## 2024-04-09 DIAGNOSIS — E291 Testicular hypofunction: Secondary | ICD-10-CM | POA: Insufficient documentation

## 2024-04-09 DIAGNOSIS — I10 Essential (primary) hypertension: Secondary | ICD-10-CM | POA: Insufficient documentation

## 2024-04-09 DIAGNOSIS — K219 Gastro-esophageal reflux disease without esophagitis: Secondary | ICD-10-CM | POA: Insufficient documentation

## 2024-04-09 DIAGNOSIS — F1010 Alcohol abuse, uncomplicated: Secondary | ICD-10-CM | POA: Insufficient documentation

## 2024-04-09 DIAGNOSIS — M67442 Ganglion, left hand: Secondary | ICD-10-CM | POA: Insufficient documentation

## 2024-04-09 DIAGNOSIS — M19041 Primary osteoarthritis, right hand: Secondary | ICD-10-CM | POA: Insufficient documentation

## 2024-04-09 DIAGNOSIS — G2581 Restless legs syndrome: Secondary | ICD-10-CM | POA: Insufficient documentation

## 2024-04-09 DIAGNOSIS — M19049 Primary osteoarthritis, unspecified hand: Secondary | ICD-10-CM | POA: Insufficient documentation

## 2024-04-09 DIAGNOSIS — G4730 Sleep apnea, unspecified: Secondary | ICD-10-CM | POA: Insufficient documentation

## 2024-04-09 DIAGNOSIS — D509 Iron deficiency anemia, unspecified: Secondary | ICD-10-CM | POA: Insufficient documentation

## 2024-04-09 DIAGNOSIS — M810 Age-related osteoporosis without current pathological fracture: Secondary | ICD-10-CM | POA: Insufficient documentation

## 2024-04-09 DIAGNOSIS — F419 Anxiety disorder, unspecified: Secondary | ICD-10-CM | POA: Insufficient documentation

## 2024-04-09 DIAGNOSIS — M2550 Pain in unspecified joint: Secondary | ICD-10-CM | POA: Insufficient documentation

## 2024-04-09 DIAGNOSIS — Z8546 Personal history of malignant neoplasm of prostate: Secondary | ICD-10-CM | POA: Insufficient documentation

## 2024-04-09 DIAGNOSIS — E782 Mixed hyperlipidemia: Secondary | ICD-10-CM | POA: Insufficient documentation

## 2024-04-09 DIAGNOSIS — N5201 Erectile dysfunction due to arterial insufficiency: Secondary | ICD-10-CM | POA: Insufficient documentation

## 2024-04-13 ENCOUNTER — Ambulatory Visit: Admitting: Family Medicine

## 2024-05-29 ENCOUNTER — Ambulatory Visit

## 2024-05-29 ENCOUNTER — Ambulatory Visit: Attending: Cardiology | Admitting: Cardiology

## 2024-05-29 ENCOUNTER — Encounter: Payer: Self-pay | Admitting: Cardiology

## 2024-05-29 VITALS — BP 110/90 | HR 121 | Ht 68.0 in | Wt 167.0 lb

## 2024-05-29 DIAGNOSIS — Z8249 Family history of ischemic heart disease and other diseases of the circulatory system: Secondary | ICD-10-CM | POA: Diagnosis not present

## 2024-05-29 DIAGNOSIS — I10 Essential (primary) hypertension: Secondary | ICD-10-CM | POA: Diagnosis not present

## 2024-05-29 DIAGNOSIS — I4719 Other supraventricular tachycardia: Secondary | ICD-10-CM | POA: Diagnosis not present

## 2024-05-29 DIAGNOSIS — E782 Mixed hyperlipidemia: Secondary | ICD-10-CM | POA: Diagnosis not present

## 2024-05-29 DIAGNOSIS — I499 Cardiac arrhythmia, unspecified: Secondary | ICD-10-CM | POA: Diagnosis not present

## 2024-05-29 DIAGNOSIS — G4733 Obstructive sleep apnea (adult) (pediatric): Secondary | ICD-10-CM

## 2024-05-29 NOTE — Progress Notes (Unsigned)
 "  Cardiology Consultation:    Date:  05/29/2024   ID:  Logan Wong, DOB 04-21-1951, MRN [id number]  PCP:  Watt Harlene BROCKS, MD  Cardiologist:  Lamar Fitch, MD   Referring MD: Watt Harlene BROCKS, MD   Chief Complaint  Patient presents with   Irregular Heart Beat    History of Present Illness:    Logan Wong is a 74 y.o. male who is being seen today for the evaluation of irregular heart rate at the request of Copland, Harlene BROCKS, MD. past medical history significant for prostate cancer, hyperlipidemia, gastroesophageal reflux disease, anxiety.  He was referred to us  because he was found to be tachycardic with irregular rate initial suspicion was for atrial fibrillation however EKG showed evidence of multifocal atrial tachycardia.  He is completely unaware of this problem he does not feel his heart speeding up.  Overall he is doing quite well he exercise on the regular basis since summer he does have a recumbent bike and elliptical and he does not a few times a week with no difficulties.  No chest pain tightness squeezing pressure in chest.  No swelling of lower extremities no palpitations no dizziness no passing out.  He quit smoking many years ago.  Drinks some alcohol.  Does not have family history of premature coronary artery disease  Past Medical History:  Diagnosis Date   Allergies    Allergy    Anxiety    Cancer (HCC)    Depression    GERD (gastroesophageal reflux disease)    Heart murmur    Hyperlipidemia    Hypertension    Osteoarthritis    Osteoporosis     Past Surgical History:  Procedure Laterality Date   PROSTATECTOMY  2019   ROTATOR CUFF REPAIR Right 2010   THULIUM LASER TURP (TRANSURETHRAL RESECTION OF PROSTATE)  2007    Current Medications: Active Medications[1]   Allergies:   Patient has no known allergies.   Social History   Socioeconomic History   Marital status: Married    Spouse name: Logan Wong   Number of children: Not on file   Years of  education: Not on file   Highest education level: Bachelor's degree (e.g., BA, AB, BS)  Occupational History   Occupation: retired  Tobacco Use   Smoking status: Former    Current packs/day: 0.00    Average packs/day: 1 pack/day for 12.0 years (12.0 ttl pk-yrs)    Types: Cigarettes    Start date: 83    Quit date: 1986    Years since quitting: 40.0   Smokeless tobacco: Never  Vaping Use   Vaping status: Never Used  Substance and Sexual Activity   Alcohol use: Not Currently    Comment: special occasion only   Drug use: No   Sexual activity: Not on file  Other Topics Concern   Not on file  Social History Narrative   Patient is married with 2 step-sons   Social Drivers of Health   Tobacco Use: Medium Risk (05/29/2024)   Patient History    Smoking Tobacco Use: Former    Smokeless Tobacco Use: Never    Passive Exposure: Not on Actuary Strain: Low Risk (02/29/2024)   Overall Financial Resource Strain (CARDIA)    Difficulty of Paying Living Expenses: Not very hard  Food Insecurity: No Food Insecurity (02/29/2024)   Epic    Worried About Running Out of Food in the Last Year: Never true    Ran Out  of Food in the Last Year: Never true  Transportation Needs: No Transportation Needs (02/29/2024)   Epic    Lack of Transportation (Medical): No    Lack of Transportation (Non-Medical): No  Physical Activity: Sufficiently Active (02/29/2024)   Exercise Vital Sign    Days of Exercise per Week: 5 days    Minutes of Exercise per Session: 30 min  Stress: No Stress Concern Present (02/29/2024)   [employer] of Occupational Health - Occupational Stress Questionnaire    Feeling of Stress: Not at all  Social Connections: Moderately Integrated (02/29/2024)   Social Connection and Isolation Panel    Frequency of Communication with Friends and Family: Once a week    Frequency of Social Gatherings with Friends and Family: Once a week    Attends Religious Services: More  than 4 times per year    Active Member of Clubs or Organizations: Yes    Attends Banker Meetings: More than 4 times per year    Marital Status: Married  Depression (PHQ2-9): Low Risk (02/20/2024)   Depression (PHQ2-9)    PHQ-2 Score: 0  Alcohol Screen: Low Risk (02/29/2024)   Alcohol Screen    Last Alcohol Screening Score (AUDIT): 1  Housing: Low Risk (02/29/2024)   Epic    Unable to Pay for Housing in the Last Year: No    Number of Times Moved in the Last Year: 0    Homeless in the Last Year: No  Utilities: Not At Risk (11/29/2023)   Epic    Threatened with loss of utilities: No  Health Literacy: Adequate Health Literacy (11/29/2023)   B1300 Health Literacy    Frequency of need for help with medical instructions: Never     Family History: The patient's family history includes COPD in his father and mother; Cancer in his father and sister; Early death in his sister and sister; Heart attack in his father and mother; Hypertension in his mother; Osteoarthritis in his mother. There is no history of Colon polyps, Colon cancer, Esophageal cancer, Rectal cancer, or Stomach cancer. ROS:   Please see the history of present illness.    All 14 point review of systems negative except as described per history of present illness.  EKGs/Labs/Other Studies Reviewed:    The following studies were reviewed today: Echocardiogram done on 03/07/2024 show: IMPRESSIONS     1. Left ventricular ejection fraction, by estimation, is 55 to 60%. The  left ventricle has normal function. The left ventricle has no regional  wall motion abnormalities. There is mild concentric left ventricular  hypertrophy. Left ventricular diastolic  parameters were normal.   2. Right ventricular systolic function is normal. The right ventricular  size is normal. Tricuspid regurgitation signal is inadequate for assessing  PA pressure.   3. The mitral valve is normal in structure. No evidence of mitral valve   regurgitation.   4. The aortic valve has an indeterminant number of cusps. Aortic valve  regurgitation is not visualized.   5. The inferior vena cava is normal in size with greater than 50%  respiratory variability, suggesting right atrial pressure of 3 mmHg.   EKG:  EKG Interpretation Date/Time:  Tuesday May 29 2024 13:39:54 EST Ventricular Rate:  121 PR Interval:  158 QRS Duration:  82 QT Interval:  332 QTC Calculation: 471 R Axis:   42  Text Interpretation: Sinus tachycardia with Premature atrial complexes ST & T wave abnormality, consider anterolateral ischemia When compared with ECG of 10-Aug-2006 12:41, Premature  atrial complexes are now Present Vent. rate has increased BY  52 BPM ST now depressed in Anterior leads Nonspecific T wave abnormality now evident in Inferior leads T wave inversion now evident in Anterolateral leads Confirmed by Bernie Charleston [phone]) on 05/29/2024 1:55:41 PM    Recent Labs: 02/20/2024: ALT 25; BUN 19; Creatinine, Ser 0.90; Hemoglobin 14.5; Platelets 274.0; Potassium 3.5; Sodium 140; TSH 1.34  Recent Lipid Panel    Component Value Date/Time   CHOL 174 02/20/2024 1439   TRIG 78.0 02/20/2024 1439   HDL 76.00 02/20/2024 1439   CHOLHDL 2 02/20/2024 1439   VLDL 15.6 02/20/2024 1439   LDLCALC 83 02/20/2024 1439    Physical Exam:    VS:  BP (!) 110/90   Pulse (!) 121   Ht 5' 8 (1.727 m)   Wt 167 lb (75.8 kg)   SpO2 95%   BMI 25.39 kg/m     Wt Readings from Last 3 Encounters:  05/29/24 167 lb (75.8 kg)  03/21/24 161 lb (73 kg)  03/01/24 161 lb 9.6 oz (73.3 kg)     GEN:  Well nourished, well developed in no acute distress HEENT: Normal NECK: No JVD; No carotid bruits LYMPHATICS: No lymphadenopathy CARDIAC: RRR, no murmurs, no rubs, no gallops RESPIRATORY:  Clear to auscultation without rales, wheezing or rhonchi  ABDOMEN: Soft, non-tender, non-distended MUSCULOSKELETAL:  No edema; No deformity  SKIN: Warm and dry NEUROLOGIC:   Alert and oriented x 3 PSYCHIATRIC:  Normal affect   ASSESSMENT:    1. Irregularly irregular pulse rhythm   2. Multifocal atrial tachycardia   3. Essential hypertension   4. Obstructive sleep apnea syndrome   5. Mixed hyperlipidemia    PLAN:    In order of problems listed above:  Irregular rhythm a EKG showed multifocal atrial tachycardia.  This is sometimes difficult rhythm to manage I will ask him to wear Zio patch for 2 weeks to make sure he does not have any different arrhythmia and was the heart rate variability is in the future anticipate to start small dose of beta-blocker however until we get complete of ablation of this problem I prefer not to initiate any therapy now to suppress this. Risk factors for coronary artery disease with elevated cholesterol.  Excellently managed by primary care physician his LDL is 85 we will schedule him to have a calcium score to help to determine how aggressive we need to be with his management of cholesterol. Obstructive sleep apnea that being followed by primary care physician. Mixed dyslipidemia discussion as above.  Will do calcium score to determine how aggressive we need to be with management   Medication Adjustments/Labs and Tests Ordered: Current medicines are reviewed at length with the patient today.  Concerns regarding medicines are outlined above.  Orders Placed This Encounter  Procedures   EKG 12-Lead   No orders of the defined types were placed in this encounter.   Signed, [place] DOROTHA Bernie, MD, [facility]. 05/29/2024 2:10 PM    [facility]    [1]  Current Meds  Medication Sig   albuterol  (VENTOLIN  HFA) 108 (90 Base) MCG/ACT inhaler Inhale 1-2 puffs into the lungs every 6 (six) hours as needed for wheezing or shortness of breath.   Calcium-Vitamin D-Vitamin K (VIACTIV) 500-500-40 MG-UNT-MCG CHEW Chew 1 tablet by mouth every morning.   diclofenac Sodium (VOLTAREN) 1 % GEL    ibandronate  (BONIVA ) 150 MG  tablet Take 1 tablet (150 mg total) by mouth every  30 (thirty) days. Take in the morning with a full glass of water, on an empty stomach, and do not take anything else by mouth or lie down for the next 30 min.   meloxicam  (MOBIC ) 7.5 MG tablet Take 1 tablet (7.5 mg total) by mouth daily.   Multiple Vitamin (MULTIVITAMIN WITH MINERALS) TABS tablet Take 1 tablet by mouth every morning.   pantoprazole  (PROTONIX ) 40 MG tablet Take 1 tablet (40 mg total) by mouth every morning.   pravastatin  (PRAVACHOL ) 40 MG tablet Take 1 tablet (40 mg total) by mouth every morning.   rOPINIRole  (REQUIP ) 1 MG tablet Take 1 tablet by mouth 1-3 hours before bedtime   tadalafil  (CIALIS ) 5 MG tablet TAKE 1 TABLET BY MOUTH DAILY AS NEEDED FOR ERECTILE DYSFUNCTION   valsartan -hydrochlorothiazide  (DIOVAN -HCT) 320-12.5 MG tablet Take 1 tablet by mouth every morning.   venlafaxine  XR (EFFEXOR -XR) 150 MG 24 hr capsule Take 1 capsule (150 mg total) by mouth daily with breakfast.   "

## 2024-05-29 NOTE — Patient Instructions (Signed)
 Medication Instructions:  Your physician recommends that you continue on your current medications as directed. Please refer to the Current Medication list given to you today.  *If you need a refill on your cardiac medications before your next appointment, please call your pharmacy*   Lab Work: None Ordered If you have labs (blood work) drawn today and your tests are completely normal, you will receive your results only by: MyChart Message (if you have MyChart) OR A paper copy in the mail If you have any lab test that is abnormal or we need to change your treatment, we will call you to review the results.   Testing/Procedures: We will order CT coronary calcium score. It will cost $99.00 and is not covered by insurance.  Please call to schedule.    [facility] [address] [place], [place] [address] [phone]     WHY IS MY DOCTOR PRESCRIBING ZIO? The Zio system is proven and trusted by physicians to detect and diagnose irregular heart rhythms -- and has been prescribed to hundreds of thousands of patients.  The FDA has cleared the Zio system to monitor for many different kinds of irregular heart rhythms. In a study, physicians were able to reach a diagnosis 90% of the time with the Zio system1.  You can wear the Zio monitor -- a small, discreet, comfortable patch -- during your normal day-to-day activity, including while you sleep, shower, and exercise, while it records every single heartbeat for analysis.  1Barrett, P., et al. Comparison of 24 Hour Holter Monitoring Versus 14 Day Novel Adhesive Patch Electrocardiographic Monitoring. American Journal of Medicine, 2014.  ZIO VS. HOLTER MONITORING The Zio monitor can be comfortably worn for up to 14 days. Holter monitors can be worn for 24 to 48 hours, limiting the time to record any irregular heart rhythms you may have. Zio is able to capture data for the 51% of patients who have their first symptom-triggered  arrhythmia after 48 hours.1  LIVE WITHOUT RESTRICTIONS The Zio ambulatory cardiac monitor is a small, unobtrusive, and water-resistant patch--you might even forget youre wearing it. The Zio monitor records and stores every beat of your heart, whether you're sleeping, working out, or showering.     Follow-Up: At [facility], you and your health needs are our priority.  As part of our continuing mission to provide you with exceptional heart care, we have created designated Provider Care Teams.  These Care Teams include your primary Cardiologist (physician) and Advanced Practice Providers (APPs -  Physician Assistants and Nurse Practitioners) who all work together to provide you with the care you need, when you need it.  We recommend signing up for the patient portal called MyChart.  Sign up information is provided on this After Visit Summary.  MyChart is used to connect with patients for Virtual Visits (Telemedicine).  Patients are able to view lab/test results, encounter notes, upcoming appointments, etc.  Non-urgent messages can be sent to your provider as well.   To learn more about what you can do with MyChart, go to [email or web address].    Your next appointment:   2 month(s)  The format for your next appointment:   In Person  Provider:   Lamar Fitch, MD    Other Instructions NA

## 2024-06-11 ENCOUNTER — Other Ambulatory Visit: Payer: Self-pay | Admitting: Family Medicine

## 2024-06-13 ENCOUNTER — Encounter: Payer: Self-pay | Admitting: Urology

## 2024-06-14 ENCOUNTER — Other Ambulatory Visit: Payer: Self-pay

## 2024-06-14 ENCOUNTER — Emergency Department (HOSPITAL_BASED_OUTPATIENT_CLINIC_OR_DEPARTMENT_OTHER)
Admission: EM | Admit: 2024-06-14 | Discharge: 2024-06-14 | Disposition: A | Discharge status: Home / Self Care | Source: Home / Self Care | Attending: Emergency Medicine | Admitting: Emergency Medicine

## 2024-06-14 ENCOUNTER — Encounter (HOSPITAL_BASED_OUTPATIENT_CLINIC_OR_DEPARTMENT_OTHER): Payer: Self-pay

## 2024-06-14 ENCOUNTER — Other Ambulatory Visit: Payer: Self-pay | Admitting: Urology

## 2024-06-14 ENCOUNTER — Ambulatory Visit (HOSPITAL_BASED_OUTPATIENT_CLINIC_OR_DEPARTMENT_OTHER)
Admission: RE | Admit: 2024-06-14 | Discharge: 2024-06-14 | Disposition: A | Payer: Self-pay | Source: Ambulatory Visit | Attending: Cardiology | Admitting: Cardiology

## 2024-06-14 ENCOUNTER — Emergency Department (HOSPITAL_BASED_OUTPATIENT_CLINIC_OR_DEPARTMENT_OTHER)

## 2024-06-14 DIAGNOSIS — R0789 Other chest pain: Secondary | ICD-10-CM

## 2024-06-14 DIAGNOSIS — Z8249 Family history of ischemic heart disease and other diseases of the circulatory system: Secondary | ICD-10-CM

## 2024-06-14 DIAGNOSIS — R9721 Rising PSA following treatment for malignant neoplasm of prostate: Secondary | ICD-10-CM

## 2024-06-14 LAB — BASIC METABOLIC PANEL WITH GFR
Anion gap: 12 (ref 5–15)
BUN: 19 mg/dL (ref 8–23)
CO2: 29 mmol/L (ref 22–32)
Calcium: 9.6 mg/dL (ref 8.9–10.3)
Chloride: 98 mmol/L (ref 98–111)
Creatinine, Ser: 0.83 mg/dL (ref 0.61–1.24)
GFR, Estimated: >60 mL/min
Glucose, Bld: 93 mg/dL (ref 70–99)
Potassium: 3.7 mmol/L (ref 3.5–5.1)
Sodium: 139 mmol/L (ref 135–145)

## 2024-06-14 LAB — URINALYSIS, ROUTINE W REFLEX MICROSCOPIC
Bilirubin Urine: NEGATIVE
Glucose, UA: NEGATIVE mg/dL
Hgb urine dipstick: NEGATIVE
Ketones, ur: NEGATIVE mg/dL
Leukocytes,Ua: NEGATIVE
Nitrite: NEGATIVE
Protein, ur: NEGATIVE mg/dL
Specific Gravity, Urine: 1.02 (ref 1.005–1.030)
pH: 7 (ref 5.0–8.0)

## 2024-06-14 LAB — CBC
HCT: 43.9 % (ref 39.0–52.0)
Hemoglobin: 14.8 g/dL (ref 13.0–17.0)
MCH: 29.7 pg (ref 26.0–34.0)
MCHC: 33.7 g/dL (ref 30.0–36.0)
MCV: 88 fL (ref 80.0–100.0)
Platelets: 218 10*3/uL (ref 150–400)
RBC: 4.99 MIL/uL (ref 4.22–5.81)
RDW: 12.6 % (ref 11.5–15.5)
WBC: 7.4 10*3/uL (ref 4.0–10.5)
nRBC: 0 % (ref 0.0–0.2)

## 2024-06-14 LAB — TROPONIN T, HIGH SENSITIVITY
Troponin T High Sensitivity: 21 ng/L — ABNORMAL HIGH (ref 0–19)
Troponin T High Sensitivity: 20 ng/L — ABNORMAL HIGH (ref 0–19)

## 2024-06-14 LAB — TSH: TSH: 1.98 u[IU]/mL (ref 0.350–4.500)

## 2024-06-14 MED ORDER — SUCRALFATE 1 GM/10ML PO SUSP
1.0000 g | Freq: Once | ORAL | Status: AC
  Administered 2024-06-14: 1 g via ORAL
  Filled 2024-06-14: qty 10

## 2024-06-14 MED ORDER — METOPROLOL SUCCINATE ER 25 MG PO TB24: 25.0000 mg | ORAL_TABLET | Freq: Every day | ORAL | 1 refills | Status: AC

## 2024-06-14 MED ORDER — LIDOCAINE VISCOUS HCL 2 % MT SOLN
15.0000 mL | Freq: Once | OROMUCOSAL | Status: AC
  Administered 2024-06-14: 15 mL via OROMUCOSAL
  Filled 2024-06-14: qty 15

## 2024-06-14 MED ORDER — SUCRALFATE 1 GM/10ML PO SUSP: 1.0000 g | Freq: Three times a day (TID) | ORAL | 0 refills | Status: AC | PRN

## 2024-06-14 NOTE — ED Notes (Signed)
 Consult to inpatient cardiology called to [facility]

## 2024-06-14 NOTE — ED Provider Notes (Signed)
 " [place] [facility] AT [facility] Provider Note   CSN: [id number] Arrival date & time: 06/14/24  1040     Patient presents with: Chest Pain   Logan Wong is a 74 y.o. male.    Chest Pain  74 year old male with a history of hypertension, hyperlipidemia, multifocal atrial tachycardia recently seen by cardiology presents with right sided chest burning.  He states yesterday he had some right sided back pain but then after dinner which was chicken and rice he developed burning in the right chest.  He slept sitting up in a recliner and at some point the sensation improved.  This morning he had a usual breakfast and went to have his coronary calcium score CT and during that procedure when he was lying flat developed burning of the chest again.  He still has some but it is improved.    Prior to Admission medications  Medication Sig Start Date End Date Taking? Authorizing Provider  metoprolol  succinate (TOPROL -XL) 25 MG 24 hr tablet Take 1 tablet (25 mg total) by mouth daily. 06/14/24  Yes Kanija Remmel Hima, MD  sucralfate  (CARAFATE ) 1 GM/10ML suspension Take 10 mLs (1 g total) by mouth 3 (three) times daily as needed. 06/14/24  Yes Illeana Edick Hima, MD  albuterol  (VENTOLIN  HFA) 108 (90 Base) MCG/ACT inhaler Inhale 1-2 puffs into the lungs every 6 (six) hours as needed for wheezing or shortness of breath. 04/02/24   Copland, Jessica C, MD  Calcium-Vitamin D-Vitamin K (VIACTIV) 500-500-40 MG-UNT-MCG CHEW Chew 1 tablet by mouth every morning.    [provider]  diclofenac Sodium (VOLTAREN) 1 % GEL     [provider]  ibandronate  (BONIVA ) 150 MG tablet Take 1 tablet (150 mg total) by mouth every 30 (thirty) days. Take in the morning with a full glass of water, on an empty stomach, and do not take anything else by mouth or lie down for the next 30 min. 01/26/23   Copland, Harlene BROCKS, MD  meloxicam  (MOBIC ) 7.5 MG tablet Take 1 tablet (7.5 mg total) by  mouth daily. 03/28/24   Copland, Jessica C, MD  Multiple Vitamin (MULTIVITAMIN WITH MINERALS) TABS tablet Take 1 tablet by mouth every morning.    [provider]  pantoprazole  (PROTONIX ) 40 MG tablet Take 1 tablet (40 mg total) by mouth every morning. 02/20/24   Copland, Harlene BROCKS, MD  pravastatin  (PRAVACHOL ) 40 MG tablet Take 1 tablet (40 mg total) by mouth every morning. 02/20/24   Copland, Harlene BROCKS, MD  rOPINIRole  (REQUIP ) 1 MG tablet Take 1 tablet by mouth 1-3 hours before bedtime 02/20/24   Copland, Jessica C, MD  tadalafil  (CIALIS ) 5 MG tablet TAKE 1 TABLET BY MOUTH DAILY AS NEEDED FOR ERECTILE DYSFUNCTION 06/11/24   Copland, Harlene BROCKS, MD  valsartan -hydrochlorothiazide  (DIOVAN -HCT) 320-12.5 MG tablet Take 1 tablet by mouth every morning. 02/20/24   Copland, Harlene BROCKS, MD  venlafaxine  XR (EFFEXOR -XR) 150 MG 24 hr capsule Take 1 capsule (150 mg total) by mouth daily with breakfast. 02/20/24   Copland, Jessica C, MD    Allergies: Patient has no known allergies.    Review of Systems  Cardiovascular:  Positive for chest pain.    Updated Vital Signs BP (!) 150/90 (BP Location: Right Arm)   Pulse (!) 109   Temp 98.3 F (36.8 C) (Oral)   Resp 18   Ht 5' 8 (1.727 m)   Wt 73.5 kg   SpO2 96%   BMI 24.63 kg/m  Physical Exam Vitals and nursing note reviewed.  Constitutional:      General: He is not in acute distress.    Appearance: He is well-developed.  HENT:     Head: Normocephalic and atraumatic.  Eyes:     Conjunctiva/sclera: Conjunctivae normal.  Cardiovascular:     Rate and Rhythm: Normal rate and regular rhythm.     Heart sounds: No murmur heard. Pulmonary:     Effort: Pulmonary effort is normal. No respiratory distress.     Breath sounds: Normal breath sounds.  Abdominal:     Palpations: Abdomen is soft.     Tenderness: There is no abdominal tenderness.  Musculoskeletal:        General: No swelling.     Cervical back: Neck supple.  Skin:    General: Skin  is warm and dry.     Capillary Refill: Capillary refill takes less than 2 seconds.  Neurological:     Mental Status: He is alert.  Psychiatric:        Mood and Affect: Mood normal.     (all labs ordered are listed, but only abnormal results are displayed) Labs Reviewed  TROPONIN T, HIGH SENSITIVITY - Abnormal; Notable for the following components:      Result Value   Troponin T High Sensitivity 21 (*)    All other components within normal limits  TROPONIN T, HIGH SENSITIVITY - Abnormal; Notable for the following components:   Troponin T High Sensitivity 20 (*)    All other components within normal limits  BASIC METABOLIC PANEL WITH GFR  CBC  URINALYSIS, ROUTINE W REFLEX MICROSCOPIC  TSH    EKG: EKG Interpretation Date/Time:  Thursday June 14 2024 10:46:54 EST Ventricular Rate:  115 PR Interval:  168 QRS Duration:  92 QT Interval:  312 QTC Calculation: 386 R Axis:   59  Text Interpretation: Sinus tachycardia normal axis TWI !, aVL, V3-V6 unchanged Nonspecific repol abnormality, diffuse leads Confirmed by Fredia Kaufman ([id number]) on 06/14/2024 11:37:11 AM  Radiology: [facility] Chest 2 View Result Date: 06/14/2024 CLINICAL DATA:  Chest pain EXAM: CHEST - 2 VIEW COMPARISON:  May 26, 2023 FINDINGS: The heart size and mediastinal contours are within normal limits. Both lungs are clear. The visualized skeletal structures are unremarkable. IMPRESSION: No active cardiopulmonary disease. Electronically Signed   By: Lynwood Landy Raddle M.D.   On: 06/14/2024 11:27     Procedures   Medications Ordered in the ED  sucralfate  (CARAFATE ) 1 GM/10ML suspension 1 g (1 g Oral Given 06/14/24 1146)  lidocaine  (XYLOCAINE ) 2 % viscous mouth solution 15 mL (15 mLs Mouth/Throat Given 06/14/24 1146)                                    Medical Decision Making Amount and/or Complexity of Data Reviewed Labs: ordered. Radiology: ordered.  Risk Prescription drug management.   74 year old male presents  with right-sided chest burning.  Feeling much better here after GI cocktail so I suspect a possible GI etiology.  Results are overall reassuring.  EKG is showing multifocal atrial tachycardia and heart rate here has been in the 100s.  Patient is asymptomatic at present.  Troponin is flat.  Discussed with cardiology Dr. Floretta who agrees with starting metoprolol  succinate 25 mg daily with follow-up with cardiology.  Patient is amenable to this.  I will also start sucralfate  as patient is already on Protonix .  He seems  very reliable and happy with home plan.All results reviewed with patient.  All questions answered.  Careful return precautions have been given.  Close follow-up advised. Patient is happy with plan and requests discharge.      Final diagnoses:  Atypical chest pain    ED Discharge Orders          Ordered    sucralfate  (CARAFATE ) 1 GM/10ML suspension  3 times daily PRN        06/14/24 1507    metoprolol  succinate (TOPROL -XL) 25 MG 24 hr tablet  Daily        06/14/24 1508               Ezreal Turay Hima, MD 06/14/24 1508  "

## 2024-06-14 NOTE — Discharge Instructions (Signed)
 You were seen in the emergency department for chest pain.  Your evaluation is reassuring.  You actually improved after treatment for indigestion so I am starting you on sucralfate  and you can continue your Protonix .  Upon discussion with cardiology we are starting you on metoprolol  which should help you with your heart rate.  Follow-up within the next week with your regular doctor or cardiologist for recheck.  If you worsen in any way, please be reevaluated immediately.

## 2024-06-14 NOTE — ED Triage Notes (Signed)
 Pt states that he began having some chest pain last night. States that pain is burning on right side of his chest. Pt states pain is intermittent today. Pain is worse when laying down.

## 2024-06-18 ENCOUNTER — Ambulatory Visit: Admitting: Family Medicine

## 2024-06-28 ENCOUNTER — Encounter (HOSPITAL_COMMUNITY)

## 2024-08-03 ENCOUNTER — Ambulatory Visit: Admitting: Cardiology

## 2024-11-29 ENCOUNTER — Ambulatory Visit
